# Patient Record
Sex: Male | Born: 1935 | Race: White | Hispanic: No | Smoking: Former smoker
Health system: Southern US, Community
[De-identification: ages and names within clinical notes are randomized; demographics above are authoritative.]

## PROBLEM LIST (undated history)

## (undated) DIAGNOSIS — Z22322 Carrier or suspected carrier of Methicillin resistant Staphylococcus aureus: Secondary | ICD-10-CM

## (undated) DIAGNOSIS — M869 Osteomyelitis, unspecified: Secondary | ICD-10-CM

## (undated) DIAGNOSIS — I2510 Atherosclerotic heart disease of native coronary artery without angina pectoris: Secondary | ICD-10-CM

## (undated) DIAGNOSIS — M810 Age-related osteoporosis without current pathological fracture: Secondary | ICD-10-CM

## (undated) DIAGNOSIS — C9590 Leukemia, unspecified not having achieved remission: Secondary | ICD-10-CM

## (undated) DIAGNOSIS — R011 Cardiac murmur, unspecified: Secondary | ICD-10-CM

## (undated) DIAGNOSIS — J189 Pneumonia, unspecified organism: Secondary | ICD-10-CM

## (undated) HISTORY — DX: Age-related osteoporosis without current pathological fracture: M81.0

## (undated) HISTORY — PX: OTHER SURGICAL HISTORY: SHX169

## (undated) HISTORY — PX: TONSILLECTOMY: SUR1361

---

## 2000-01-27 ENCOUNTER — Ambulatory Visit (HOSPITAL_COMMUNITY): Admission: RE | Admit: 2000-01-27 | Discharge: 2000-01-27 | Payer: Self-pay | Admitting: Orthopedic Surgery

## 2000-01-27 ENCOUNTER — Encounter: Payer: Self-pay | Admitting: Orthopedic Surgery

## 2000-02-08 ENCOUNTER — Encounter: Admission: RE | Admit: 2000-02-08 | Discharge: 2000-02-16 | Payer: Self-pay | Admitting: Orthopedic Surgery

## 2000-02-15 ENCOUNTER — Encounter: Admission: RE | Admit: 2000-02-15 | Discharge: 2000-02-15 | Payer: Self-pay | Admitting: Orthopedic Surgery

## 2000-02-15 ENCOUNTER — Encounter: Payer: Self-pay | Admitting: Orthopedic Surgery

## 2000-02-17 ENCOUNTER — Ambulatory Visit (HOSPITAL_BASED_OUTPATIENT_CLINIC_OR_DEPARTMENT_OTHER): Admission: RE | Admit: 2000-02-17 | Discharge: 2000-02-17 | Payer: Self-pay | Admitting: Orthopedic Surgery

## 2000-02-23 ENCOUNTER — Encounter: Admission: RE | Admit: 2000-02-23 | Discharge: 2000-05-23 | Payer: Self-pay | Admitting: Orthopedic Surgery

## 2001-12-10 ENCOUNTER — Emergency Department (HOSPITAL_COMMUNITY): Admission: EM | Admit: 2001-12-10 | Discharge: 2001-12-10 | Payer: Self-pay | Admitting: Emergency Medicine

## 2002-07-25 ENCOUNTER — Encounter: Payer: Self-pay | Admitting: General Surgery

## 2002-07-25 ENCOUNTER — Encounter: Admission: RE | Admit: 2002-07-25 | Discharge: 2002-07-25 | Payer: Self-pay | Admitting: General Surgery

## 2002-07-28 ENCOUNTER — Ambulatory Visit (HOSPITAL_BASED_OUTPATIENT_CLINIC_OR_DEPARTMENT_OTHER): Admission: RE | Admit: 2002-07-28 | Discharge: 2002-07-28 | Payer: Self-pay | Admitting: General Surgery

## 2003-09-16 ENCOUNTER — Encounter (HOSPITAL_BASED_OUTPATIENT_CLINIC_OR_DEPARTMENT_OTHER): Admission: RE | Admit: 2003-09-16 | Discharge: 2003-12-15 | Payer: Self-pay | Admitting: Internal Medicine

## 2011-06-02 ENCOUNTER — Inpatient Hospital Stay (HOSPITAL_COMMUNITY)
Admission: AD | Admit: 2011-06-02 | Discharge: 2011-06-08 | DRG: 496 | Disposition: A | Discharge status: Skilled Nursing Facility | Payer: Medicare Other | Source: Ambulatory Visit | Attending: Internal Medicine | Admitting: Internal Medicine

## 2011-06-02 ENCOUNTER — Encounter (HOSPITAL_COMMUNITY): Payer: Self-pay

## 2011-06-02 ENCOUNTER — Inpatient Hospital Stay (HOSPITAL_COMMUNITY): Payer: Medicare Other

## 2011-06-02 DIAGNOSIS — M810 Age-related osteoporosis without current pathological fracture: Secondary | ICD-10-CM | POA: Diagnosis present

## 2011-06-02 DIAGNOSIS — Z79899 Other long term (current) drug therapy: Secondary | ICD-10-CM

## 2011-06-02 DIAGNOSIS — Z22322 Carrier or suspected carrier of Methicillin resistant Staphylococcus aureus: Secondary | ICD-10-CM

## 2011-06-02 DIAGNOSIS — L03039 Cellulitis of unspecified toe: Secondary | ICD-10-CM | POA: Diagnosis present

## 2011-06-02 DIAGNOSIS — F17200 Nicotine dependence, unspecified, uncomplicated: Secondary | ICD-10-CM | POA: Diagnosis present

## 2011-06-02 DIAGNOSIS — L02619 Cutaneous abscess of unspecified foot: Secondary | ICD-10-CM | POA: Diagnosis present

## 2011-06-02 DIAGNOSIS — E871 Hypo-osmolality and hyponatremia: Secondary | ICD-10-CM | POA: Diagnosis present

## 2011-06-02 DIAGNOSIS — A4902 Methicillin resistant Staphylococcus aureus infection, unspecified site: Secondary | ICD-10-CM | POA: Diagnosis present

## 2011-06-02 DIAGNOSIS — E876 Hypokalemia: Secondary | ICD-10-CM | POA: Diagnosis present

## 2011-06-02 DIAGNOSIS — Z8701 Personal history of pneumonia (recurrent): Secondary | ICD-10-CM

## 2011-06-02 DIAGNOSIS — M869 Osteomyelitis, unspecified: Principal | ICD-10-CM | POA: Diagnosis present

## 2011-06-02 DIAGNOSIS — B961 Klebsiella pneumoniae [K. pneumoniae] as the cause of diseases classified elsewhere: Secondary | ICD-10-CM | POA: Diagnosis present

## 2011-06-02 DIAGNOSIS — M129 Arthropathy, unspecified: Secondary | ICD-10-CM | POA: Diagnosis present

## 2011-06-02 DIAGNOSIS — L02419 Cutaneous abscess of limb, unspecified: Secondary | ICD-10-CM | POA: Diagnosis present

## 2011-06-02 DIAGNOSIS — L0390 Cellulitis, unspecified: Secondary | ICD-10-CM | POA: Diagnosis present

## 2011-06-02 HISTORY — DX: Carrier or suspected carrier of methicillin resistant Staphylococcus aureus: Z22.322

## 2011-06-02 HISTORY — DX: Osteomyelitis, unspecified: M86.9

## 2011-06-02 HISTORY — DX: Pneumonia, unspecified organism: J18.9

## 2011-06-02 LAB — COMPREHENSIVE METABOLIC PANEL
AST: 35 U/L (ref 0–37)
Albumin: 2.5 g/dL — ABNORMAL LOW (ref 3.5–5.2)
Alkaline Phosphatase: 85 U/L (ref 39–117)
BUN: 12 mg/dL (ref 6–23)
Chloride: 99 mEq/L (ref 96–112)
Creatinine, Ser: 0.74 mg/dL (ref 0.50–1.35)
Potassium: 3.5 mEq/L (ref 3.5–5.1)
Total Protein: 6.7 g/dL (ref 6.0–8.3)

## 2011-06-02 LAB — CBC
HCT: 36.1 % — ABNORMAL LOW (ref 39.0–52.0)
MCHC: 33.5 g/dL (ref 30.0–36.0)
Platelets: 361 10*3/uL (ref 150–400)
RDW: 12.7 % (ref 11.5–15.5)
WBC: 9.7 10*3/uL (ref 4.0–10.5)

## 2011-06-02 LAB — MAGNESIUM: Magnesium: 2.1 mg/dL (ref 1.5–2.5)

## 2011-06-02 LAB — PHOSPHORUS: Phosphorus: 3 mg/dL (ref 2.3–4.6)

## 2011-06-02 MED ORDER — PIPERACILLIN-TAZOBACTAM 3.375 G IVPB
3.3750 g | Freq: Once | INTRAVENOUS | Status: AC
  Administered 2011-06-02: 3.375 g via INTRAVENOUS
  Filled 2011-06-02: qty 50

## 2011-06-02 MED ORDER — HYDROCODONE-ACETAMINOPHEN 5-325 MG PO TABS
1.0000 | ORAL_TABLET | ORAL | Status: DC | PRN
  Administered 2011-06-03 – 2011-06-04 (×3): 1 via ORAL
  Administered 2011-06-04 (×2): 2 via ORAL
  Administered 2011-06-05 (×2): 1 via ORAL
  Administered 2011-06-05 – 2011-06-06 (×3): 2 via ORAL
  Administered 2011-06-06: 1 via ORAL
  Administered 2011-06-06 – 2011-06-08 (×7): 2 via ORAL
  Filled 2011-06-02 (×2): qty 2
  Filled 2011-06-02: qty 1
  Filled 2011-06-02 (×2): qty 2
  Filled 2011-06-02 (×2): qty 1
  Filled 2011-06-02 (×4): qty 2
  Filled 2011-06-02: qty 1
  Filled 2011-06-02 (×3): qty 2
  Filled 2011-06-02: qty 1
  Filled 2011-06-02 (×4): qty 2

## 2011-06-02 MED ORDER — ENOXAPARIN SODIUM 40 MG/0.4ML ~~LOC~~ SOLN
40.0000 mg | SUBCUTANEOUS | Status: DC
  Administered 2011-06-02 – 2011-06-04 (×3): 40 mg via SUBCUTANEOUS
  Filled 2011-06-02 (×5): qty 0.4

## 2011-06-02 MED ORDER — MORPHINE SULFATE 2 MG/ML IJ SOLN
1.0000 mg | INTRAMUSCULAR | Status: DC | PRN
  Administered 2011-06-03 – 2011-06-07 (×4): 1 mg via INTRAVENOUS
  Filled 2011-06-02 (×4): qty 1

## 2011-06-02 MED ORDER — VANCOMYCIN HCL IN DEXTROSE 1-5 GM/200ML-% IV SOLN
1000.0000 mg | Freq: Two times a day (BID) | INTRAVENOUS | Status: DC
  Administered 2011-06-03 – 2011-06-08 (×11): 1000 mg via INTRAVENOUS
  Filled 2011-06-02 (×13): qty 200

## 2011-06-02 MED ORDER — ONDANSETRON HCL 4 MG PO TABS: 4.0000 mg | ORAL_TABLET | Freq: Four times a day (QID) | ORAL | Status: DC | PRN

## 2011-06-02 MED ORDER — ENOXAPARIN SODIUM 30 MG/0.3ML ~~LOC~~ SOLN
30.0000 mg | SUBCUTANEOUS | Status: DC
  Filled 2011-06-02 (×2): qty 0.3

## 2011-06-02 MED ORDER — ONDANSETRON HCL 4 MG/2ML IJ SOLN: 4.0000 mg | Freq: Four times a day (QID) | INTRAMUSCULAR | Status: DC | PRN

## 2011-06-02 MED ORDER — PIPERACILLIN-TAZOBACTAM 3.375 G IVPB
3.3750 g | Freq: Three times a day (TID) | INTRAVENOUS | Status: DC
  Administered 2011-06-03 – 2011-06-04 (×5): 3.375 g via INTRAVENOUS
  Filled 2011-06-02 (×8): qty 50

## 2011-06-02 MED ORDER — SODIUM CHLORIDE 0.9 % IV SOLN
INTRAVENOUS | Status: AC
  Administered 2011-06-02: 50 mL/h via INTRAVENOUS

## 2011-06-02 MED ORDER — VANCOMYCIN HCL IN DEXTROSE 1-5 GM/200ML-% IV SOLN
1000.0000 mg | Freq: Once | INTRAVENOUS | Status: AC
  Administered 2011-06-02: 1000 mg via INTRAVENOUS
  Filled 2011-06-02: qty 200

## 2011-06-02 NOTE — Progress Notes (Signed)
ANTIBIOTIC CONSULT NOTE - INITIAL  Pharmacy Consult for Vancomycin/Zosyn Indication: LLE cellulitis  No Known Allergies  Patient Measurements: Per patient: Height: 72 in Weight: 78kg   Vital Signs: Temp: 98.7 F (37.1 C) (03/22 1359) Temp src: Oral (03/22 1359) BP: 134/78 mmHg (03/22 1359) Pulse Rate: 79  (03/22 1359) Intake/Output from previous day:   Intake/Output from this shift:    Labs: No results found for this basename: WBC:3,HGB:3,PLT:3,LABCREA:3,CREATININE:3 in the last 72 hours    Microbiology: None ordered  Medical History: Past Medical History  Diagnosis Date  . Pneumonia     Medications:  Scheduled:    . enoxaparin  30 mg Subcutaneous Q24H   Infusions:    . sodium chloride     Assessment:  87 YOM   Goal of Therapy:  Vancomycin trough level 15-20 mcg/ml until osteo ruled out.  Plan:   Vancomycin 1gm IV q12h  Check trough at steady state Zosyn 3.375gm IV q8h (4hr extended infusions)  Follow renal function, cultures & MRI results   Loralee Pacas, PharmD, BCPS Pager: [phone] 06/02/2011,2:53 PM

## 2011-06-02 NOTE — H&P (Signed)
PCP:  Ezequiel Kayser, MD, MD   DOA:  06/02/2011  1:16 PM  Chief Complaint:  Left LE swelling  HPI: Pt is 78 yo male who presents with left lower extremity swelling and erythema that initially started one week prior to admission and has been getting progressively worse. He was given antibiotics but is not sure of the name. He has not had any improvement on that antibiotic. Swelling however has gotten better. He describes subjective fevers and chill, no chest pain or shortness of breath, no other systemic symptoms, no abdominal or urinary concerns. No similar events in the past, as difficulty weight bearing on the left extremity but can still ambulate. No specific aggravating or alleviating factors.   Allergies: No Known Allergies  Prior to Admission medications   Not on File    Past Medical History  Diagnosis Date  . Pneumonia     Past Surgical History  Procedure Date  . Left  rotator cuff repair   . Tonsillectomy     Social History:  reports that he has been smoking Cigars.  He has never used smokeless tobacco. He reports that he does not drink alcohol or use illicit drugs.  History reviewed. No pertinent family history.  Review of Systems:  Constitutional: Denies diaphoresis, appetite change and fatigue.  HEENT: Denies photophobia, eye pain, redness, hearing loss, ear pain, congestion, sore throat, rhinorrhea, sneezing, mouth sores, trouble swallowing, neck pain, neck stiffness and tinnitus.   Respiratory: Denies SOB, DOE, cough, chest tightness,  and wheezing.   Cardiovascular: Denies chest pain, palpitations and leg swelling.  Gastrointestinal: Denies nausea, vomiting, abdominal pain, diarrhea, constipation, blood in stool and abdominal distention.  Genitourinary: Denies dysuria, urgency, frequency, hematuria, flank pain and difficulty urinating.  Skin: Denies pallor, rash and wound.  Neurological: Denies dizziness, seizures, syncope, weakness, light-headedness, numbness  and headaches.  Hematological: Denies adenopathy. Easy bruising, personal or family bleeding history  Psychiatric/Behavioral: Denies suicidal ideation, mood changes, confusion, nervousness, sleep disturbance and agitation  Physical Exam:  Filed Vitals:   06/02/11 1359  BP: 134/78  Pulse: 79  Temp: 98.7 F (37.1 C)  TempSrc: Oral  Resp: 16  SpO2: 98%    Constitutional: Vital signs reviewed.  Patient is in no acute distress and cooperative with exam. Alert and oriented x3.  Head: Normocephalic and atraumatic Ear: TM normal bilaterally Mouth: no erythema or exudates, MMM Eyes: PERRL, EOMI, conjunctivae normal, No scleral icterus.  Neck: Supple, Trachea midline normal ROM, No JVD, mass, thyromegaly, or carotid bruit present.  Cardiovascular: RRR, S1 normal, S2 normal, no MRG, pulses symmetric and intact bilaterally Pulmonary/Chest: CTAB, no wheezes, rales, or rhonchi Abdominal: Soft. Non-tender, non-distended, bowel sounds are normal, no masses, organomegaly, or guarding present.  GU: no CVA tenderness Musculoskeletal: No joint deformities, erythema, or stiffness, ROM full and no nontender Ext: Left lower extremity swelling and erythema extending from the foot to the knee, tender to palpation, warm to touch, no acute findings on the right lower extremity Hematology: no cervical, inginal, or axillary adenopathy.  Neurological: A&O x3, Strenght is normal and symmetric bilaterally, cranial nerve II-XII are grossly intact, no focal motor deficit, sensory intact to light touch bilaterally.  Skin: Warm, dry and intact. No rash, cyanosis, or clubbing.  Psychiatric: Normal mood and affect. speech and behavior is normal. Judgment and thought content normal. Cognition and memory are normal.   Labs on Admission:  No results found for this or any previous visit (from the past 48 hour(s)).  Radiological Exams  on Admission: No results found.  Assessment/Plan  Left Lower extremity  cellulitis - will admit to medical floor and monitor vitals per floor protocol - obtain blood work: CBC, BMP, blood cultures if T > 101.23F - start broad spectrum antibiotics for now and taper down if improving - may need to get surgery consult if no improvement over 24 hours - keep extremity elevated - awaiting medical reconciliation to be completed to address home medications  DVT Prophylaxis - Lovenox  Code Status - Full  Education  - test results and diagnostic studies were discussed with patient  - patient verbalized the understanding - questions were answered at the bedside and contact information was provided for additional questions or concerns  Time Spent on Admission: Over 30 minutes  MAGICK-Kamali Nephew 06/02/2011, 2:32 PM  [facility] Pager # [phone] Main Office # [phone]

## 2011-06-03 ENCOUNTER — Inpatient Hospital Stay (HOSPITAL_COMMUNITY): Payer: Medicare Other

## 2011-06-03 LAB — CBC
HCT: 34.8 % — ABNORMAL LOW (ref 39.0–52.0)
Hemoglobin: 11.5 g/dL — ABNORMAL LOW (ref 13.0–17.0)
MCH: 31.3 pg (ref 26.0–34.0)
MCV: 94.8 fL (ref 78.0–100.0)
RBC: 3.67 MIL/uL — ABNORMAL LOW (ref 4.22–5.81)

## 2011-06-03 LAB — BASIC METABOLIC PANEL
CO2: 27 mEq/L (ref 19–32)
Calcium: 8.5 mg/dL (ref 8.4–10.5)
Chloride: 94 mEq/L — ABNORMAL LOW (ref 96–112)
Glucose, Bld: 93 mg/dL (ref 70–99)
Potassium: 3.1 mEq/L — ABNORMAL LOW (ref 3.5–5.1)
Sodium: 130 mEq/L — ABNORMAL LOW (ref 135–145)

## 2011-06-03 LAB — HEMOGLOBIN A1C
Hgb A1c MFr Bld: 5.3 % (ref <5.7)
Mean Plasma Glucose: 105 mg/dL (ref <117)

## 2011-06-03 MED ORDER — POTASSIUM CHLORIDE CRYS ER 20 MEQ PO TBCR
40.0000 meq | EXTENDED_RELEASE_TABLET | Freq: Two times a day (BID) | ORAL | Status: AC
  Administered 2011-06-03 (×2): 40 meq via ORAL
  Filled 2011-06-03 (×2): qty 2

## 2011-06-03 NOTE — Consult Note (Signed)
Reason for Consult: left great toe infection  Referring Physician: MD AZARIEL BANIK is an 78 y.o. male.  HPI: s/p corn removal developed infection tx opt with antibiotics by Podiatrist. Admit with infection.  Past Medical History  Diagnosis Date  . Pneumonia     Past Surgical History  Procedure Date  . Left  rotator cuff repair   . Tonsillectomy     History reviewed. No pertinent family history.  Social History:  reports that he has been smoking Cigars.  He has never used smokeless tobacco. He reports that he does not drink alcohol or use illicit drugs.  Allergies: No Known Allergies  Medications: I have reviewed the patient's current medications.  Results for orders placed during the hospital encounter of 06/02/11 (from the past 48 hour(s))  CBC     Status: Abnormal   Collection Time   06/02/11  3:13 PM      Component Value Range Comment   WBC 9.7  4.0 - 10.5 (K/uL)    RBC 3.81 (*) 4.22 - 5.81 (MIL/uL)    Hemoglobin 12.1 (*) 13.0 - 17.0 (g/dL)    HCT 16.1 (*) 09.6 - 52.0 (%)    MCV 94.8  78.0 - 100.0 (fL)    MCH 31.8  26.0 - 34.0 (pg)    MCHC 33.5  30.0 - 36.0 (g/dL)    RDW 04.5  40.9 - 81.1 (%)    Platelets 361  150 - 400 (K/uL)   COMPREHENSIVE METABOLIC PANEL     Status: Abnormal   Collection Time   06/02/11  3:13 PM      Component Value Range Comment   Sodium 135  135 - 145 (mEq/L)    Potassium 3.5  3.5 - 5.1 (mEq/L)    Chloride 99  96 - 112 (mEq/L)    CO2 29  19 - 32 (mEq/L)    Glucose, Bld 97  70 - 99 (mg/dL)    BUN 12  6 - 23 (mg/dL)    Creatinine, Ser 9.14  0.50 - 1.35 (mg/dL)    Calcium 8.8  8.4 - 10.5 (mg/dL)    Total Protein 6.7  6.0 - 8.3 (g/dL)    Albumin 2.5 (*) 3.5 - 5.2 (g/dL)    AST 35  0 - 37 (U/L)    ALT 38  0 - 53 (U/L)    Alkaline Phosphatase 85  39 - 117 (U/L)    Total Bilirubin 0.4  0.3 - 1.2 (mg/dL)    GFR calc non Af Amer 82 (*) >90 (mL/min)    GFR calc Af Amer >90  >90 (mL/min)   MAGNESIUM     Status: Normal   Collection Time   06/02/11  3:13 PM      Component Value Range Comment   Magnesium 2.1  1.5 - 2.5 (mg/dL)   PHOSPHORUS     Status: Normal   Collection Time   06/02/11  3:13 PM      Component Value Range Comment   Phosphorus 3.0  2.3 - 4.6 (mg/dL)   TSH     Status: Normal   Collection Time   06/02/11  3:13 PM      Component Value Range Comment   TSH 0.643  0.350 - 4.500 (uIU/mL)   HEMOGLOBIN A1C     Status: Normal   Collection Time   06/02/11  3:13 PM      Component Value Range Comment   Hemoglobin A1C 5.3  <5.7 (%)  Mean Plasma Glucose 105  <117 (mg/dL)   BASIC METABOLIC PANEL     Status: Abnormal   Collection Time   06/03/11  4:35 AM      Component Value Range Comment   Sodium 130 (*) 135 - 145 (mEq/L)    Potassium 3.1 (*) 3.5 - 5.1 (mEq/L)    Chloride 94 (*) 96 - 112 (mEq/L)    CO2 27  19 - 32 (mEq/L)    Glucose, Bld 93  70 - 99 (mg/dL)    BUN 10  6 - 23 (mg/dL)    Creatinine, Ser 1.61  0.50 - 1.35 (mg/dL)    Calcium 8.5  8.4 - 10.5 (mg/dL)    GFR calc non Af Amer 77 (*) >90 (mL/min)    GFR calc Af Amer 89 (*) >90 (mL/min)   CBC     Status: Abnormal   Collection Time   06/03/11  4:35 AM      Component Value Range Comment   WBC 9.8  4.0 - 10.5 (K/uL)    RBC 3.67 (*) 4.22 - 5.81 (MIL/uL)    Hemoglobin 11.5 (*) 13.0 - 17.0 (g/dL)    HCT 09.6 (*) 04.5 - 52.0 (%)    MCV 94.8  78.0 - 100.0 (fL)    MCH 31.3  26.0 - 34.0 (pg)    MCHC 33.0  30.0 - 36.0 (g/dL)    RDW 40.9  81.1 - 91.4 (%)    Platelets 385  150 - 400 (K/uL)     [name]  06/03/2011  *RADIOLOGY REPORT*  Clinical Data: Open wound on the great toe and dorsal aspect of the distal metatarsal.  Question osteomyelitis.  MRI OF THE LEFT FOREFOOT WITHOUT CONTRAST  Technique:  Multiplanar, multisequence MR imaging was performed. No intravenous contrast was administered.  Comparison: None.  Findings: A large ulceration is identified in the dorsal soft tissues centered over the proximal phalanx of the great toe.  A fluid  collection is seen over the dorsal aspect of the distal phalanx measuring 1.3 cm transverse by 0.9 cm cranial-caudal by 1.7 cm long.  Diffuse and marked subcutaneous edema is present about the foot.  There is bone marrow edema throughout the distal phalanx of the great toe.  Also seen is marrow edema in the proximal phalanx of the great toe.  Edema is most intense in the distal 0.7 cm of the proximal phalanx with mild marrow edema also seen throughout the diaphysis.  Bone marrow signal is otherwise normal.  The patient has small joint effusions at all of the MTP joints.  No mass is identified. No muscle or tendon tear.  IMPRESSION:  1.  Large skin ulceration over the dorsum of the great toe.  Fluid collection over the dorsum of the distal phalanx is consistent with an abscess. 2.  Signal abnormality throughout the distal phalanx of the great toe is consistent with osteomyelitis.  Marrow signal abnormality is also seen in the proximal phalanx of the great toe worst in the head compatible with osteomyelitis. 3.  Diffuse subcutaneous edema over the dorsum of the foot could be due to cellulitis and/or dependent change.  Original Report Authenticated By: Bernadene Bell. Maricela Curet, M.D.    Review of Systems  Constitutional: Negative.   HENT: Negative.   Eyes: Negative.   Respiratory: Negative.   Cardiovascular: Negative.   Gastrointestinal: Negative.   Genitourinary: Negative.   Musculoskeletal: Positive for joint pain.  Skin: Negative.   Neurological: Negative.  Endo/Heme/Allergies: Negative.   Psychiatric/Behavioral: Negative.    Blood pressure 133/74, pulse 74, temperature 97.8 F (36.6 C), temperature source Oral, resp. rate 20, height 6' (1.829 m), weight 77.565 kg (171 lb), SpO2 93.00%. Physical Exam  Constitutional: He is oriented to person, place, and time. He appears well-developed.  HENT:  Head: Normocephalic.  Eyes: Pupils are equal, round, and reactive to light.  Cardiovascular: Normal rate.    Respiratory: Effort normal.  GI: Soft.  Musculoskeletal: He exhibits edema and tenderness.       Left great toe erythema. Open draining wound distal and dorsal. Nail loose. 1+DP Pt sensation intact. No DVT. Ankle good ROM no pain. No pain MTP.  Neurological: He is alert and oriented to person, place, and time.  Skin: There is erythema.  Psychiatric: He has a normal mood and affect. His behavior is normal.    Assessment/Plan: Infected left great toe with osteo of distal and proximal phalanx with abscess. Plan I&D risks discussed. Op report dictated [id number]. Culture obtained. Plan dressing change tomorrow check cultures. Continue Ab. Will consult Dr. Victorino Dike or Lestine Box for further management. NWB Jordie Schreur [id number]. Xray.  Tiburcio Linder C 06/03/2011, 11:11 AM

## 2011-06-03 NOTE — Progress Notes (Signed)
Patient ID: [name], male   DOB: [birth year], 78 y.o.   MRN: [id number]  Subjective: No events overnight. Patient denies chest pain, shortness of breath, abdominal pain. He reports less pain in left lower extremity.  Objective:  Vital signs in last 24 hours:  Filed Vitals:   06/02/11 1359 06/02/11 2130 06/03/11 0620  BP: 134/78 127/69 133/74  Pulse: 79 93 74  Temp: 98.7 F (37.1 C) 98.6 F (37 C) 97.8 F (36.6 C)  TempSrc: Oral Oral Oral  Resp: 16 20 20   Height: 6' (1.829 m)    Weight: 77.565 kg (171 lb)    SpO2: 98% 94% 93%    Intake/Output from previous day:   Intake/Output Summary (Last 24 hours) at 06/03/11 1427 Last data filed at 06/03/11 0830  Gross per 24 hour  Intake   1700 ml  Output      0 ml  Net   1700 ml    Physical Exam: General: Alert, awake, oriented x3, in no acute distress. HEENT: No bruits, no goiter. Moist mucous membranes, no scleral icterus, no conjunctival pallor. Heart: Regular rate and rhythm, S1/S2 +, no murmurs, rubs, gallops. Lungs: Clear to auscultation bilaterally. No wheezing, no rhonchi, no rales.  Abdomen: Soft, nontender, nondistended, positive bowel sounds. Extremities: +1 lower extremity pitting edema (improving) with erythema )also improving), left great toe with open wound and draining pus, erythematous and tender to palpation Neuro: Grossly nonfocal.  Lab Results:  Basic Metabolic Panel:    Component Value Date/Time   NA 130* 06/03/2011 0435   K 3.1* 06/03/2011 0435   CL 94* 06/03/2011 0435   CO2 27 06/03/2011 0435   BUN 10 06/03/2011 0435   CREATININE 0.86 06/03/2011 0435   GLUCOSE 93 06/03/2011 0435   CALCIUM 8.5 06/03/2011 0435   CBC:    Component Value Date/Time   WBC 9.8 06/03/2011 0435   HGB 11.5* 06/03/2011 0435   HCT 34.8* 06/03/2011 0435   PLT 385 06/03/2011 0435   MCV 94.8 06/03/2011 0435      Lab 06/03/11 0435 06/02/11 1513  WBC 9.8 9.7  HGB 11.5* 12.1*  HCT 34.8* 36.1*  PLT 385 361  MCV 94.8 94.8    MCH 31.3 31.8  MCHC 33.0 33.5  RDW 12.8 12.7  LYMPHSABS -- --  MONOABS -- --  EOSABS -- --  BASOSABS -- --  BANDABS -- --    Lab 06/03/11 0435 06/02/11 1513  NA 130* 135  K 3.1* 3.5  CL 94* 99  CO2 27 29  GLUCOSE 93 97  BUN 10 12  CREATININE 0.86 0.74  CALCIUM 8.5 8.8  MG -- 2.1   Studies/Results: [name]  06/03/2011  *RADIOLOGY REPORT*  Clinical Data: Open wound on the great toe and dorsal aspect of the distal metatarsal.  Question osteomyelitis.  MRI OF THE LEFT FOREFOOT WITHOUT CONTRAST  Technique:  Multiplanar, multisequence MR imaging was performed. No intravenous contrast was administered.  Comparison: None.  Findings: A large ulceration is identified in the dorsal soft tissues centered over the proximal phalanx of the great toe.  A fluid collection is seen over the dorsal aspect of the distal phalanx measuring 1.3 cm transverse by 0.9 cm cranial-caudal by 1.7 cm long.  Diffuse and marked subcutaneous edema is present about the foot.  There is bone marrow edema throughout the distal phalanx of the great toe.  Also seen is marrow edema in the proximal phalanx of the great toe.  Edema  is most intense in the distal 0.7 cm of the proximal phalanx with mild marrow edema also seen throughout the diaphysis.  Bone marrow signal is otherwise normal.  The patient has small joint effusions at all of the MTP joints.  No mass is identified. No muscle or tendon tear.  IMPRESSION:  1.  Large skin ulceration over the dorsum of the great toe.  Fluid collection over the dorsum of the distal phalanx is consistent with an abscess. 2.  Signal abnormality throughout the distal phalanx of the great toe is consistent with osteomyelitis.  Marrow signal abnormality is also seen in the proximal phalanx of the great toe worst in the head compatible with osteomyelitis. 3.  Diffuse subcutaneous edema over the dorsum of the foot could be due to cellulitis and/or dependent change.  Original Report  Authenticated By: Bernadene Bell. Maricela Curet, M.D.    Medications: Scheduled Meds:   . enoxaparin  40 mg Subcutaneous Q24H  . piperacillin-tazobactam (ZOSYN)  IV  3.375 g Intravenous Once  . piperacillin-tazobactam (ZOSYN)  IV  3.375 g Intravenous Q8H  . potassium chloride  40 mEq Oral BID  . vancomycin  1,000 mg Intravenous Once  . vancomycin  1,000 mg Intravenous Q12H  . DISCONTD: enoxaparin  30 mg Subcutaneous Q24H   Continuous Infusions:   . sodium chloride 50 mL/hr (06/02/11 1530)   PRN Meds:.HYDROcodone-acetaminophen, morphine, ondansetron (ZOFRAN) IV, ondansetron  Assessment/Plan:  Left Lower extremity cellulitis  - slight improvement over 24 hours on broad spectrum antibiotics - infection in the great left toes still unchanged and will likely need I&D - ortho consult obtained for further evaluation - continue supportive care with analgesia, antibiotics - keep extremity elevated   Osteomyelitis - please see the MRI findings noted above - follow up on ortho recommendations - continue antibiotics   Hypokalemia - supplement - BMP in AM  Hyponatremia - likely pre-renal etiology - IVF provided - BMP in AM  DVT Prophylaxis - Lovenox  Code Status - Full    EDUCATION - test results and diagnostic studies were discussed with patient and pt's family who was present at the bedside (daughter) - patient and family have verbalized the understanding - questions were answered at the bedside and contact information was provided for additional questions or concerns   LOS: 1 day   Debbora Presto 06/03/2011, 2:27 PM  [facility] Pager: [phone]

## 2011-06-04 LAB — CBC
Platelets: 375 10*3/uL (ref 150–400)
RBC: 3.51 MIL/uL — ABNORMAL LOW (ref 4.22–5.81)
RDW: 13.2 % (ref 11.5–15.5)
WBC: 10.3 10*3/uL (ref 4.0–10.5)

## 2011-06-04 LAB — BASIC METABOLIC PANEL
CO2: 27 mEq/L (ref 19–32)
Calcium: 8 mg/dL — ABNORMAL LOW (ref 8.4–10.5)
Chloride: 106 mEq/L (ref 96–112)
GFR calc Af Amer: 86 mL/min — ABNORMAL LOW (ref >90)
Sodium: 138 mEq/L (ref 135–145)

## 2011-06-04 LAB — VANCOMYCIN, TROUGH: Vancomycin Tr: 15 ug/mL (ref 10.0–20.0)

## 2011-06-04 NOTE — Progress Notes (Signed)
ANTIBIOTIC CONSULT NOTE - Follow-Up  Pharmacy Consult for Vancomycin/Zosyn Indication: LLE cellulitis  No Known Allergies  Patient Measurements: Per patient: Height: 72 in Weight: 78kg   Vital Signs: Temp: 97.4 F (36.3 C) (03/24 1500) Temp src: Oral (03/24 1500) BP: 133/76 mmHg (03/24 1500) Pulse Rate: 78  (03/24 1500) Intake/Output from previous day: 03/23 0701 - 03/24 0700 In: 3220 [P.O.:720; I.V.:1950; IV Piggyback:550] Out: -  Intake/Output from this shift: Total I/O In: 720 [P.O.:720] Out: -   Labs:  Basename 06/04/11 0450 06/03/11 0435 06/02/11 1513  WBC 10.3 9.8 9.7  HGB 11.0* 11.5* 12.1*  PLT 375 385 361  LABCREA -- -- --  CREATININE 0.94 0.86 0.74      Microbiology: None ordered  Medical History: Past Medical History  Diagnosis Date  . Pneumonia     Medications:  Scheduled:     . enoxaparin  40 mg Subcutaneous Q24H  . potassium chloride  40 mEq Oral BID  . vancomycin  1,000 mg Intravenous Q12H  . DISCONTD: piperacillin-tazobactam (ZOSYN)  IV  3.375 g Intravenous Q8H   Infusions:   Assessment:  Day #3 Vancomycin for cellulitis/abscess/osteomyelitis  Vanc trough at goal  Goal of Therapy:  Vancomycin trough level 15-20 mcg/ml  Plan:   Continue Vancomycin 1g IV q12  Hessie Knows, PharmD, BCPS pager [phone] 06/04/2011,4:09 PM

## 2011-06-04 NOTE — Progress Notes (Signed)
Orthopedics Progress Note  Subjective: Pt resting comfortably in no acute distress. Minimal pain to left great toe today  Objective:  Filed Vitals:   06/04/11 0605  BP: 113/80  Pulse: 71  Temp: 97.3 F (36.3 C)  Resp: 20    General: Awake and alert  Musculoskeletal: Left great toe with mild erythema and mild edema, nv intact distally Toe nail removed yesterday, Pt non weight bearing on left great toe Neurovascularly intact  Lab Results  Component Value Date   WBC 10.3 06/04/2011   HGB 11.0* 06/04/2011   HCT 33.8* 06/04/2011   MCV 96.3 06/04/2011   PLT 375 06/04/2011       Component Value Date/Time   NA 138 06/04/2011 0450   K 4.0 06/04/2011 0450   CL 106 06/04/2011 0450   CO2 27 06/04/2011 0450   GLUCOSE 93 06/04/2011 0450   BUN 10 06/04/2011 0450   CREATININE 0.94 06/04/2011 0450   CALCIUM 8.0* 06/04/2011 0450   GFRNONAA 74* 06/04/2011 0450   GFRAA 86* 06/04/2011 0450    No results found for this basename: INR, PROTIME    Assessment/Plan: Infection left great toe   Continue IV antibiotics Dressing change daily  Will consult with Dr. Victorino Dike about further recommendations  Almedia Balls. Ranell Patrick, MD 06/04/2011 7:44 AM

## 2011-06-04 NOTE — Progress Notes (Signed)
Patient ID: [name], male   DOB: 03/28/1924, 78 y.o.   MRN: [id number]  Subjective: No events overnight. Patient denies chest pain, shortness of breath, abdominal pain.  Objective:  Vital signs in last 24 hours:  Filed Vitals:   06/03/11 0620 06/03/11 1455 06/03/11 2149 06/04/11 0605  BP: 133/74 123/65 111/67 113/80  Pulse: 74 74 78 71  Temp: 97.8 F (36.6 C) 100.8 F (38.2 C) 97.8 F (36.6 C) 97.3 F (36.3 C)  TempSrc: Oral Oral Oral Oral  Resp: 20 20 20 20   Height:      Weight:      SpO2: 93% 97% 94% 97%    Intake/Output from previous day:   Intake/Output Summary (Last 24 hours) at 06/04/11 1147 Last data filed at 06/04/11 0756  Gross per 24 hour  Intake   3340 ml  Output      0 ml  Net   3340 ml    Physical Exam: General: Alert, awake, oriented x3, in no acute distress. HEENT: No bruits, no goiter. Moist mucous membranes, no scleral icterus, no conjunctival pallor. Heart: Regular rate and rhythm, S1/S2 +, no murmurs, rubs, gallops. Lungs: Clear to auscultation bilaterally. No wheezing, no rhonchi, no rales.  Abdomen: Soft, nontender, nondistended, positive bowel sounds. Neuro: Grossly nonfocal.  Lab Results:  Basic Metabolic Panel:  Lab 06/04/11 4098 06/03/11 0435 06/02/11 1513  WBC 10.3 9.8 9.7  HGB 11.0* 11.5* 12.1*  HCT 33.8* 34.8* 36.1*  PLT 375 385 361  MCV 96.3 94.8 94.8  MCH 31.3 31.3 31.8  MCHC 32.5 33.0 33.5  RDW 13.2 12.8 12.7  LYMPHSABS -- -- --  MONOABS -- -- --  EOSABS -- -- --  BASOSABS -- -- --  BANDABS -- -- --    Lab 06/04/11 0450 06/03/11 0435 06/02/11 1513  NA 138 130* 135  K 4.0 3.1* 3.5  CL 106 94* 99  CO2 27 27 29   GLUCOSE 93 93 97  BUN 10 10 12   CREATININE 0.94 0.86 0.74  CALCIUM 8.0* 8.5 8.8  MG -- -- 2.1    Recent Results (from the past 240 hour(s))  WOUND CULTURE     Status: Normal (Preliminary result)   Collection Time   06/03/11 11:15 AM      Component Value Range Status Comment   Specimen Description  TOE   Final    Special Requests Normal   Final    Gram Stain     Final    Value: NO WBC SEEN     RARE SQUAMOUS EPITHELIAL CELLS PRESENT     NO ORGANISMS SEEN   Culture     Final    Value: MODERATE STAPHYLOCOCCUS AUREUS     Note: RIFAMPIN AND GENTAMICIN SHOULD NOT BE USED AS SINGLE DRUGS FOR TREATMENT OF STAPH INFECTIONS.   Report Status PENDING   Incomplete     Studies/Results: [name]  06/03/2011  *RADIOLOGY REPORT*  Clinical Data: Open wound on the great toe and dorsal aspect of the distal metatarsal.  Question osteomyelitis.  MRI OF THE LEFT FOREFOOT WITHOUT CONTRAST  Technique:  Multiplanar, multisequence MR imaging was performed. No intravenous contrast was administered.  Comparison: None.  Findings: A large ulceration is identified in the dorsal soft tissues centered over the proximal phalanx of the great toe.  A fluid collection is seen over the dorsal aspect of the distal phalanx measuring 1.3 cm transverse by 0.9 cm cranial-caudal by 1.7 cm long.  Diffuse and  marked subcutaneous edema is present about the foot.  There is bone marrow edema throughout the distal phalanx of the great toe.  Also seen is marrow edema in the proximal phalanx of the great toe.  Edema is most intense in the distal 0.7 cm of the proximal phalanx with mild marrow edema also seen throughout the diaphysis.  Bone marrow signal is otherwise normal.  The patient has small joint effusions at all of the MTP joints.  No mass is identified. No muscle or tendon tear.  IMPRESSION:  1.  Large skin ulceration over the dorsum of the great toe.  Fluid collection over the dorsum of the distal phalanx is consistent with an abscess. 2.  Signal abnormality throughout the distal phalanx of the great toe is consistent with osteomyelitis.  Marrow signal abnormality is also seen in the proximal phalanx of the great toe worst in the head compatible with osteomyelitis. 3.  Diffuse subcutaneous edema over the dorsum of the foot  could be due to cellulitis and/or dependent change.  Original Report Authenticated By: Bernadene Bell. D'ALESSIO, M.D.   Dg Foot Complete Left  06/03/2011  *RADIOLOGY REPORT*  Clinical Data: Evaluate great toe post I&D  LEFT FOOT - COMPLETE 3+ VIEW  Comparison: Left foot MRI - 06/02/2011  Findings: Soft tissue defect about the tip/tuft of the great toe. This findings/with osteolysis of the tuft of the great toe compatible with osteomyelitis demonstrated on prior foot MRI. There is extensive soft tissue swelling about the foot.  No radiopaque foreign body.   No fracture.  Joint spaces are grossly preserved. Plantar calcaneal spur.  IMPRESSION: 1.  Soft tissue defect adjacent to the tuft of the great toe compatible with provided history of I&D.  No radiopaque foreign body.  2.  Osteolysis of the tuft of the great toe worrisome for osteomyelitis as demonstrated on foot MRI.  Original Report Authenticated By: Waynard Reeds, M.D.    Medications: Scheduled Meds:   . enoxaparin  40 mg Subcutaneous Q24H  . piperacillin-tazobactam (ZOSYN)  IV  3.375 g Intravenous Q8H  . potassium chloride  40 mEq Oral BID  . vancomycin  1,000 mg Intravenous Q12H   Continuous Infusions:  PRN Meds:.HYDROcodone-acetaminophen, morphine, ondansetron (ZOFRAN) IV, ondansetron  Assessment/Plan:  Left Lower extremity cellulitis  - slight improvement over 24 hours on broad spectrum antibiotics and now s/p I&D (done 06/03/2011) - appreciate ortho input - continue supportive care with analgesia, antibiotics  - keep extremity elevated   Osteomyelitis  - please see the MRI findings noted above  - follow up on ortho recommendations  - continue antibiotics but will d/c Zosyn as recommended by ID, appreciate the input  Hypokalemia  - supplement  - BMP in AM   Hyponatremia  - likely pre-renal etiology  - IVF provided  - resolved  DVT Prophylaxis - Lovenox  Code Status - Full   EDUCATION  - test results and diagnostic  studies were discussed with patient - patient verbalized the understanding  - questions were answered at the bedside and contact information was provided for additional questions or concerns    LOS: 2 days   MAGICK-Angelus Hoopes 06/04/2011, 11:47 AM  [facility] Pager: [phone]

## 2011-06-04 NOTE — Op Note (Signed)
NAMEVONN, SLIGER               ACCOUNT NO.:  [id number]  MEDICAL RECORD NO.:  [id number]  LOCATION:  1602                         FACILITY:  [facility]  PHYSICIAN:  Jene Every, M.D.    DATE OF BIRTH:  [birth year]  DATE OF PROCEDURE:  06/02/2011 DATE OF DISCHARGE:                              OPERATIVE REPORT   PREOPERATIVE DIAGNOSIS:  Osteomyelitis with abscess of the left great toe.  POSTOPERATIVE DIAGNOSIS:  Osteomyelitis with abscess of the left great toe.  PROCEDURES PERFORMED: 1. Irrigation and debridement of left great toe with removal of the     entire nail. 2. Debridement of the distal phalanx of the great toe with partial     removal of the distal phalanx of great toe. 3. Irrigation and debridement and drainage of abscess of the dorsum of     the great toe with open packing.  ANESTHESIA:  Local 1% lidocaine.  No assistant.  BRIEF HISTORY:  This is an 78 year old who was admitted to the hospital with an open wound draining after corn removal by a local podiatrist. It got infected, he was on antibiotics for 10 days.  Admitted by the hospitalist, placed him on antibiotics.  Cellulitis around the leg, foot, and ankle has improved, but he has persistent soft tissue swelling and erythema of the great toe.  He had an MRI indicating abscess in the subcutaneous tissue on the dorsum of the great toe as well as bone marrow edema in the distal phalanx and the proximal phalanx consistent with osteomyelitis.  Indicated for I and D, obtaining cultures.  Risks and benefits discussed were bleeding, infection, need for amputation, repeat debridement, DVT, PE, anesthetic complications, etc.  TECHNIQUE:  The patient in supine position, after the great toe was prepped and draped in the usual sterile fashion under strict sterile conditions, I infiltrated the great toe with 1% lidocaine, performing a digital block medially and laterally, and also on the dorsum.  The patient had a  small wound over the tip of the toe draining some mild purulent fluid.  Obtained a culture of that and sent it for aerobic and anaerobic cultures.  I then bluntly dissected it down to the open distal phalanx which was exposed and removed a small portion of the tip with a hemostat.  Then irrigated around that wound and debrided the wound edges, it was 3 x 3 mm.  I then packed that open.  The nail was loose and it was fairly devitalized.  Therefore, removed the nail without difficulty.  I opened up just beneath the nail bed with an 11 blade as radiologist indicated there was pus beneath the nail bed as well.  I got a good bleeding tissue.  Then, on the dorsum, more proximally over the ITP joint, there was a fair amount of callus formation and kind of a bloody, blistered abscess.  I incised that and was able to obtain a draining red fluid, it was consistent more with blood than it was an abscess.  I copiously irrigated all wounds and I packed each of the wound with the end of a 4 x 4's, and then we wrapped the great toe with 4 x  4's and Kerlix.  He had a good dorsalis pedis and posterior tibial pulse.  Good sensation.  He was not a diabetic.  The plan is to continue IV antibiotics, await for culture.  Consult Dr. Michele Rockers for further delineation.  He may require an amputation for osteomyelitis versus IV antibiotics for 6 weeks.  Nonweightbearing and elevation.     Jene Every, M.D.     Cordelia Pen  D:  06/03/2011  T:  06/04/2011  Job:  [id number]

## 2011-06-05 ENCOUNTER — Encounter (HOSPITAL_COMMUNITY)
Admission: AD | Disposition: A | Discharge status: Skilled Nursing Facility | Payer: Self-pay | Source: Ambulatory Visit | Attending: Internal Medicine

## 2011-06-05 ENCOUNTER — Inpatient Hospital Stay (HOSPITAL_COMMUNITY): Payer: Medicare Other | Admitting: Anesthesiology

## 2011-06-05 ENCOUNTER — Encounter (HOSPITAL_COMMUNITY): Payer: Self-pay | Admitting: Anesthesiology

## 2011-06-05 DIAGNOSIS — M869 Osteomyelitis, unspecified: Secondary | ICD-10-CM

## 2011-06-05 HISTORY — PX: AMPUTATION: SHX166

## 2011-06-05 LAB — WOUND CULTURE: Special Requests: NORMAL

## 2011-06-05 SURGERY — AMPUTATION DIGIT
Anesthesia: General | Site: Foot | Laterality: Left | Wound class: Dirty or Infected

## 2011-06-05 MED ORDER — LIDOCAINE HCL (CARDIAC) 20 MG/ML IV SOLN
INTRAVENOUS | Status: DC | PRN
  Administered 2011-06-05: 50 mg via INTRAVENOUS

## 2011-06-05 MED ORDER — ONDANSETRON HCL 4 MG PO TABS: 4.0000 mg | ORAL_TABLET | Freq: Four times a day (QID) | ORAL | Status: DC | PRN

## 2011-06-05 MED ORDER — LACTATED RINGERS IV SOLN
INTRAVENOUS | Status: DC | PRN
  Administered 2011-06-05: 17:00:00 via INTRAVENOUS

## 2011-06-05 MED ORDER — METOCLOPRAMIDE HCL 5 MG/ML IJ SOLN
5.0000 mg | Freq: Three times a day (TID) | INTRAMUSCULAR | Status: DC | PRN
Start: 2011-06-05 — End: 2011-06-08

## 2011-06-05 MED ORDER — PROPOFOL 10 MG/ML IV BOLUS
INTRAVENOUS | Status: DC | PRN
  Administered 2011-06-05: 140 mg via INTRAVENOUS

## 2011-06-05 MED ORDER — ENOXAPARIN SODIUM 40 MG/0.4ML ~~LOC~~ SOLN
40.0000 mg | SUBCUTANEOUS | Status: DC
  Administered 2011-06-06: 40 mg via SUBCUTANEOUS
  Filled 2011-06-05 (×2): qty 0.4

## 2011-06-05 MED ORDER — FENTANYL CITRATE 0.05 MG/ML IJ SOLN
25.0000 ug | INTRAMUSCULAR | Status: DC | PRN
  Administered 2011-06-05: 50 ug via INTRAVENOUS

## 2011-06-05 MED ORDER — CHLORHEXIDINE GLUCONATE 4 % EX LIQD
60.0000 mL | Freq: Once | CUTANEOUS | Status: DC
  Filled 2011-06-05: qty 60

## 2011-06-05 MED ORDER — ONDANSETRON HCL 4 MG/2ML IJ SOLN: 4.0000 mg | Freq: Four times a day (QID) | INTRAMUSCULAR | Status: DC | PRN

## 2011-06-05 MED ORDER — FENTANYL CITRATE 0.05 MG/ML IJ SOLN
INTRAMUSCULAR | Status: DC | PRN
  Administered 2011-06-05: 50 ug via INTRAVENOUS
  Administered 2011-06-05 (×2): 25 ug via INTRAVENOUS

## 2011-06-05 MED ORDER — METOCLOPRAMIDE HCL 10 MG PO TABS: 5.0000 mg | ORAL_TABLET | Freq: Three times a day (TID) | ORAL | Status: DC | PRN

## 2011-06-05 MED ORDER — ONDANSETRON HCL 4 MG/2ML IJ SOLN
INTRAMUSCULAR | Status: DC | PRN
  Administered 2011-06-05: 4 mg via INTRAVENOUS

## 2011-06-05 MED ORDER — SODIUM CHLORIDE 0.9 % IV SOLN
INTRAVENOUS | Status: DC
  Administered 2011-06-05 – 2011-06-06 (×2): via INTRAVENOUS

## 2011-06-05 MED ORDER — HYDROMORPHONE HCL PF 1 MG/ML IJ SOLN
0.5000 mg | INTRAMUSCULAR | Status: DC | PRN
  Administered 2011-06-05: 0.5 mg via INTRAVENOUS
  Filled 2011-06-05: qty 1

## 2011-06-05 MED ORDER — LACTATED RINGERS IV SOLN: INTRAVENOUS | Status: DC

## 2011-06-05 SURGICAL SUPPLY — 30 items
BANDAGE ELASTIC 4 VELCRO ST LF (GAUZE/BANDAGES/DRESSINGS) ×1 IMPLANT
BLADE OSCILLATING/SAGITTAL (BLADE) ×2
BLADE SW THK.38XMED LNG THN (BLADE) IMPLANT
BNDG CMPR 9X4 STRL LF SNTH (GAUZE/BANDAGES/DRESSINGS) ×1
BNDG COHESIVE 6X5 TAN STRL LF (GAUZE/BANDAGES/DRESSINGS) ×2 IMPLANT
BNDG ESMARK 4X9 LF (GAUZE/BANDAGES/DRESSINGS) ×2 IMPLANT
CHLORAPREP W/TINT 26ML (MISCELLANEOUS) ×2 IMPLANT
CLOTH BEACON ORANGE TIMEOUT ST (SAFETY) ×2 IMPLANT
CUFF TOURN SGL QUICK 34 (TOURNIQUET CUFF) ×2
CUFF TRNQT CYL 34X4X40X1 (TOURNIQUET CUFF) ×1 IMPLANT
DRAPE U-SHAPE 47X51 STRL (DRAPES) ×2 IMPLANT
DRSG ADAPTIC 3X8 NADH LF (GAUZE/BANDAGES/DRESSINGS) ×2 IMPLANT
DRSG EMULSION OIL 3X16 NADH (GAUZE/BANDAGES/DRESSINGS) ×1 IMPLANT
ELECT REM PT RETURN 9FT ADLT (ELECTROSURGICAL) ×2
ELECTRODE REM PT RTRN 9FT ADLT (ELECTROSURGICAL) ×1 IMPLANT
GAUZE SPONGE 4X4 16PLY XRAY LF (GAUZE/BANDAGES/DRESSINGS) ×1 IMPLANT
GLOVE BIO SURGEON STRL SZ8 (GLOVE) ×4 IMPLANT
GLOVE BIOGEL PI IND STRL 8 (GLOVE) ×1 IMPLANT
GLOVE BIOGEL PI INDICATOR 8 (GLOVE) ×1
GOWN PREVENTION PLUS XLARGE (GOWN DISPOSABLE) ×2 IMPLANT
GOWN STRL NON-REIN LRG LVL3 (GOWN DISPOSABLE) ×2 IMPLANT
KIT BASIN OR (CUSTOM PROCEDURE TRAY) ×2 IMPLANT
MANIFOLD NEPTUNE II (INSTRUMENTS) ×2 IMPLANT
PACK LOWER EXTREMITY WL (CUSTOM PROCEDURE TRAY) ×2 IMPLANT
PAD CAST 4YDX4 CTTN HI CHSV (CAST SUPPLIES) ×1 IMPLANT
PADDING CAST COTTON 4X4 STRL (CAST SUPPLIES) ×2
SPONGE GAUZE 4X4 12PLY (GAUZE/BANDAGES/DRESSINGS) ×2 IMPLANT
SUCTION FRAZIER TIP 10 FR DISP (SUCTIONS) ×2 IMPLANT
SUT ETHILON 3 0 PS 1 (SUTURE) ×2 IMPLANT
TOWEL OR 17X26 10 PK STRL BLUE (TOWEL DISPOSABLE) ×2 IMPLANT

## 2011-06-05 NOTE — Brief Op Note (Signed)
06/02/2011 - 06/05/2011  6:06 PM  PATIENT:  [name]  78 y.o. male  PRE-OPERATIVE DIAGNOSIS:  Left great toe osteomylitis  POST-OPERATIVE DIAGNOSIS:  Left great toe osteomylitis  Procedure(s): L hallux amputation with metatarsal head resection  SURGEON:  Toni Arthurs, MD  ASSISTANT: n/a  ANESTHESIA:   General  EBL:  minimal   TOURNIQUET:  24 min with ankle esmarch  COMPLICATIONS:  None apparent  DISPOSITION:  Extubated, awake and stable to recovery.  DICTATION ID:  [id number]

## 2011-06-05 NOTE — Consult Note (Signed)
WOC consult Note Reason for Consult: Left Great Toe and Left Foot Wound type: Gangrenous abscess of left foot.  I&D a tthe bedside over the weekend by D.r beane, for OR today with Dr. Victorino Dike.  I will not see and will not follow. Pressure Ulcer POA: No Thanks, Ladona Mow, MSN, RN, [facility], [employer]

## 2011-06-05 NOTE — Transfer of Care (Signed)
Immediate Anesthesia Transfer of Care Note  Patient: [name]  Procedure(s) Performed: Procedure(s) (LRB): AMPUTATION DIGIT (Left)  Patient Location: PACU  Anesthesia Type: General  Level of Consciousness: awake and sedated  Airway & Oxygen Therapy: Patient Spontanous Breathing and Patient connected to face mask oxygen  Post-op Assessment: Report given to PACU RN and Post -op Vital signs reviewed and stable  Post vital signs: Reviewed and stable  Complications: No apparent anesthesia complications

## 2011-06-05 NOTE — Consult Note (Signed)
Infectious Diseases Initial Consultation  Reason for Consultation:  Left Hallux osteomyelitis   HPI: [name] is a 78 y.o. male in good state of health who presented to [facility] on 3/22 with 10 day history of worsening left lower leg swelling and erythema. He reports having recent podiatry work done with removal of a corn to his left great toe. He noticed 5 days later having increasing tenderness, redness and swelling to his left leg. He subsequently started oral antibiotics, amox/clav plus cipro, on 05/26/11, concerning for infection by his podiatrist. Upon follow-up with his podiatrist on 3/19, it appeared that his foot did not have considerable improvement of his cellulitis and was referred to go to the hospital for further evaluation. He reported subjective fevers and chills for 3 days prior to admit with Tmax of 100.4. He denies nightsweats, no nausea/vomiting/backpain .He  predominantly came in due to discomfort with weight bearing, swelling and erythema to his left leg, and podiatrist recommendation. On admit he was found to have WBC on upper limits of normal at 9.7, no differential taken at that time .He also underwent a plain xray and MRI of Left foot that suggested he had small abscess as well as bone marrow signal suggestive of osteomyelitis of left hallux. He did have a T max of 100.73F on 06/03/11, but no blood cultures were taken at that time.  He was started initially on vancomycin and pip/tazo and underwent an I X D on 3/24 where the abscess was debrided, toe nail removed, and removal of distal phalynx. Tissue cultures from that surgery revealed MRSA ( oxacillin R, vanco MIC 1). His antibiotics have been narrowed to vancomycin alone on 06/05/11. He is now being evaluated for amputation of left hallux for optimal treatment of osteomyelitis. He has been therapeutic on vancomycin at 2gm daily, and no subsequent fevers. The patient does not have history of peripheral vascular disease nor  diabetes.  Past Medical History  Diagnosis Date  . Pneumonia   - s/p tonsillectomy - s/p hernia repair - s/p repair of tendon for anterior dislocation of Left shoulder - osteoporosis - arthritis - last echocardiogram in 2010  PCP = [facility] at [facility]  Allergies: No Known Allergies  Current antibiotics: Vancomycin 2gm IV daily , started 3/22 Pip/tazo 06/02/11- 06/04/11  Current meds:    . chlorhexidine  60 mL Topical Once  . vancomycin  1,000 mg Intravenous Q12H  . DISCONTD: enoxaparin  40 mg Subcutaneous Q24H    Home meds: Fosamax cipro augmentin Calcium-vitamin d multivitamin  History  Substance Use Topics  . Smoking status: Current Some Day Smoker    Types: Cigars  . Smokeless tobacco: Never Used  . Alcohol Use: No   Social history = is widower since oct 2012, lives alone. Active gardener. Exercises at home. Retired after 36yrs working for [employer]. [place] was in the [employer] [place] Now smokes 1 cigar a day. Occasional wine. No illicit drug use. Patient is self sufficient, would like to do home IV antibiotics and home PT rather than SNF.  History reviewed. No pertinent family history.  Review of Systems  Constitutional: Negative for fever, chills, diaphoresis, activity change, appetite change, fatigue and unexpected weight change.  HENT: Negative for congestion, sore throat, rhinorrhea, sneezing, trouble swallowing and sinus pressure.  Eyes: Negative for photophobia and visual disturbance.  Respiratory: Negative for cough, chest tightness, shortness of breath, wheezing and stridor.  Cardiovascular: Negative for chest pain, palpitations and leg swelling.  Gastrointestinal:  Negative for nausea, vomiting, abdominal pain, diarrhea, constipation, blood in stool, abdominal distention and anal bleeding.  Genitourinary: Negative for dysuria, hematuria, flank pain and difficulty urinating.  Musculoskeletal: Negative for myalgias, back pain. Right leg still  somewhat swollen. Skin: Negative for color change, pallor, rash and wound.  Neurological: Negative for dizziness, tremors, weakness and light-headedness.  Hematological: Negative for adenopathy. Does not bruise/bleed easily.  Psychiatric/Behavioral: Negative for behavioral problems, confusion, sleep disturbance, dysphoric mood, decreased concentration and agitation.    OBJECTIVE: Temp:  [97.4 F (36.3 C)-98.1 F (36.7 C)] 98.1 F (36.7 C) (03/25 0600) Pulse Rate:  [72-86] 72  (03/25 0600) Resp:  [19-20] 19  (03/25 0600) BP: (118-147)/(64-86) 118/64 mmHg (03/25 0600) SpO2:  [91 %-97 %] 94 % (03/25 0600) BP 118/64  Pulse 72  Temp(Src) 98.1 F (36.7 C) (Oral)  Resp 19  Ht 6' (1.829 m)  Wt 171 lb (77.565 kg)  BMI 23.19 kg/m2  SpO2 94%  General Appearance:    Alert, cooperative, no distress, appears stated age  Head:    Normocephalic, without obvious abnormality, atraumatic  Eyes:    PERRL, conjunctiva/corneas clear, EOM's intact, fundi    benign, both eyes       Ears:    Normal TM's and external ear canals, both ears  Nose:   Nares normal, septum midline, mucosa normal, no drainage   or sinus tenderness  Throat:   Lips, mucosa, and tongue normal; teeth and gums normal  Neck:   Supple, symmetrical, trachea midline, no adenopathy;         Back:     Symmetric, no curvature, ROM normal, no CVA tenderness  Lungs:     Clear to auscultation bilaterally, respirations unlabored     Heart:    Regular rate and rhythm, S1 and S2 normal, occ. pvc.no murmur, rub or gallop  Abdomen:     Soft, non-tender, bowel sounds active all four quadrants,    no masses, no organomegaly        Extremities:   Extremities normal, atraumatic, no cyanosis or edema; left foot wrapped from recent surgery, mild blanching erythema to left ankle with + 1 pitting edema > than right leg. Mild arthritic deformity to pointer fingers bilaterally to DIP. No warmth no tenderness.   Pulses:   2+ and symmetric all  extremities  Skin:   Skin color, texture, turgor normal, no rashes or lesions; no splinter hemorrhage, no subconjunctival petechaie.   Lymph nodes:   Cervical, supraclavicular, and axillary nodes normal  Neurologic:   CNII-XII intact. Normal strength, sensation and reflexes      throughout   LABS: Results for orders placed during the hospital encounter of 06/02/11 (from the past 48 hour(s))  CBC     Status: Abnormal   Collection Time   06/04/11  4:50 AM      Component Value Range Comment   WBC 10.3  4.0 - 10.5 (K/uL)    RBC 3.51 (*) 4.22 - 5.81 (MIL/uL)    Hemoglobin 11.0 (*) 13.0 - 17.0 (g/dL)    HCT 47.8 (*) 29.5 - 52.0 (%)    MCV 96.3  78.0 - 100.0 (fL)    MCH 31.3  26.0 - 34.0 (pg)    MCHC 32.5  30.0 - 36.0 (g/dL)    RDW 62.1  30.8 - 65.7 (%)    Platelets 375  150 - 400 (K/uL)   BASIC METABOLIC PANEL     Status: Abnormal   Collection Time   06/04/11  4:50 AM      Component Value Range Comment   Sodium 138  135 - 145 (mEq/L)    Potassium 4.0  3.5 - 5.1 (mEq/L)    Chloride 106  96 - 112 (mEq/L)    CO2 27  19 - 32 (mEq/L)    Glucose, Bld 93  70 - 99 (mg/dL)    BUN 10  6 - 23 (mg/dL)    Creatinine, Ser 1.61  0.50 - 1.35 (mg/dL)    Calcium 8.0 (*) 8.4 - 10.5 (mg/dL)    GFR calc non Af Amer 74 (*) >90 (mL/min)    GFR calc Af Amer 86 (*) >90 (mL/min)   VANCOMYCIN, TROUGH     Status: Normal   Collection Time   06/04/11  3:20 PM      Component Value Range Comment   Vancomycin Tr 15.0  10.0 - 20.0 (ug/mL)     MICRO: 06/03/11 tissue cx: MRSA (oxacillin R, vanco 1,  clinda <0.25 , erythro > 8  , bactrim < 10   , doxy < 1, and rif <0.5  )  IMAGING MRI FOOT 06/02/11: Findings: A large ulceration is identified in the dorsal soft  tissues centered over the proximal phalanx of the great toe. A  fluid collection is seen over the dorsal aspect of the distal  phalanx measuring 1.3 cm transverse by 0.9 cm cranial-caudal by 1.7  cm long. Diffuse and marked subcutaneous edema is present  about  the foot.  There is bone marrow edema throughout the distal phalanx of the  great toe. Also seen is marrow edema in the proximal phalanx of  the great toe. Edema is most intense in the distal 0.7 cm of the  proximal phalanx with mild marrow edema also seen throughout the  diaphysis. Bone marrow signal is otherwise normal.  The patient has small joint effusions at all of the MTP joints. No  mass is identified. No muscle or tendon tear.  IMPRESSION:  1. Large skin ulceration over the dorsum of the great toe. Fluid  collection over the dorsum of the distal phalanx is consistent with  an abscess.  2. Signal abnormality throughout the distal phalanx of the great  toe is consistent with osteomyelitis. Marrow signal abnormality is  also seen in the proximal phalanx of the great toe worst in the  head compatible with osteomyelitis.  3. Diffuse subcutaneous edema over the dorsum of the foot could be  due to cellulitis and/or dependent change.   Assessment/Plan:  78yo M with left hallux soft tissue infection & osteomyelitis due to MRSA s/p I X D on 3/24, currently therapeutic on vancomycin.  -- recommend that the patient to receive at least 4 weeks of IV antibiotics.  -- In patient's who undergo full amputation of the affected area, they generally would only receive "mop-up" < 2wks of antibiotics; however, since we did not collect blood cultures on admit or when he had a fever, it is unclear if he also had MRSA bacteremia. A conservative approach would be to treat presumptively with an extended course of 4-6 wks of vancomycin.  --  Will check blood cultures x 2 today; can place picc line if blood cultures negative.  -- goal of vancomycin trough should be 15-20. On discharge, the patient will need weekly cbc, bmp, and vanco trough.  -- we will check baseline ESR and CRP for tomorrow's labs  -- we will have the patient follow up in the ID clinic in 4-6  wks as part of follow up for IV  antibiotics in treatment of osteomyelitis

## 2011-06-05 NOTE — Progress Notes (Signed)
Patient ID: [name], male   DOB: Jun 25, 1924, 78 y.o.   MRN: [id number]  Subjective: No events overnight. Patient denies chest pain, shortness of breath, abdominal pain. Had bowel movement and reports ambulating.  Objective:  Vital signs in last 24 hours:  Filed Vitals:   06/04/11 1500 06/04/11 2115 06/05/11 0600 06/05/11 1405  BP: 133/76 147/86 118/64 150/82  Pulse: 78 86 72 84  Temp: 97.4 F (36.3 C) 97.9 F (36.6 C) 98.1 F (36.7 C) 97.8 F (36.6 C)  TempSrc: Oral Oral Oral   Resp: 20 19 19 16   Height:      Weight:      SpO2: 97% 91% 94% 96%    Intake/Output from previous day:   Intake/Output Summary (Last 24 hours) at 06/05/11 1451 Last data filed at 06/05/11 0900  Gross per 24 hour  Intake   1120 ml  Output      0 ml  Net   1120 ml    Physical Exam: General: Alert, awake, oriented x3, in no acute distress. HEENT: No bruits, no goiter. Moist mucous membranes, no scleral icterus, no conjunctival pallor. Heart: Regular rate and rhythm, S1/S2 +, no murmurs, rubs, gallops. Lungs: Clear to auscultation bilaterally. No wheezing, no rhonchi, no rales.  Abdomen: Soft, nontender, nondistended, positive bowel sounds. Extremities: Erythema and swelling in left lower extremity improving but toe infection itself with no significant progress Neuro: Grossly nonfocal.  Lab Results:  Basic Metabolic Panel:    Component Value Date/Time   NA 138 06/04/2011 0450   K 4.0 06/04/2011 0450   CL 106 06/04/2011 0450   CO2 27 06/04/2011 0450   BUN 10 06/04/2011 0450   CREATININE 0.94 06/04/2011 0450   GLUCOSE 93 06/04/2011 0450   CALCIUM 8.0* 06/04/2011 0450   CBC:    Component Value Date/Time   WBC 10.3 06/04/2011 0450   HGB 11.0* 06/04/2011 0450   HCT 33.8* 06/04/2011 0450   PLT 375 06/04/2011 0450   MCV 96.3 06/04/2011 0450      Lab 06/04/11 0450 06/03/11 0435 06/02/11 1513  WBC 10.3 9.8 9.7  HGB 11.0* 11.5* 12.1*  HCT 33.8* 34.8* 36.1*  PLT 375 385 361  MCV 96.3 94.8  94.8  MCH 31.3 31.3 31.8  MCHC 32.5 33.0 33.5  RDW 13.2 12.8 12.7  LYMPHSABS -- -- --  MONOABS -- -- --  EOSABS -- -- --  BASOSABS -- -- --  BANDABS -- -- --    Lab 06/04/11 0450 06/03/11 0435 06/02/11 1513  NA 138 130* 135  K 4.0 3.1* 3.5  CL 106 94* 99  CO2 27 27 29   GLUCOSE 93 93 97  BUN 10 10 12   CREATININE 0.94 0.86 0.74  CALCIUM 8.0* 8.5 8.8  MG -- -- 2.1   No results found for this basename: INR:5,PROTIME:5 in the last 168 hours Cardiac markers: No results found for this basename: CK:3,CKMB:3,TROPONINI:3,MYOGLOBIN:3 in the last 168 hours No components found with this basename: POCBNP:3 Recent Results (from the past 240 hour(s))  WOUND CULTURE     Status: Normal   Collection Time   06/03/11 11:15 AM      Component Value Range Status Comment   Specimen Description TOE   Final    Special Requests Normal   Final    Gram Stain     Final    Value: NO WBC SEEN     RARE SQUAMOUS EPITHELIAL CELLS PRESENT     NO ORGANISMS SEEN  Culture     Final    Value: MODERATE METHICILLIN RESISTANT STAPHYLOCOCCUS AUREUS     Note: RIFAMPIN AND GENTAMICIN SHOULD NOT BE USED AS SINGLE DRUGS FOR TREATMENT OF STAPH INFECTIONS. This organism DOES NOT demonstrate inducible Clindamycin resistance in vitro. CRITICAL RESULT CALLED TO, READ BACK BY AND VERIFIED WITH: DANA FRANKLIN      06/05/11 11:25 BY GARRS   Report Status 06/05/2011 FINAL   Final    Organism ID, Bacteria METHICILLIN RESISTANT STAPHYLOCOCCUS AUREUS   Final     Studies/Results: No results found.  Medications: Scheduled Meds:   . chlorhexidine  60 mL Topical Once  . vancomycin  1,000 mg Intravenous Q12H  . DISCONTD: enoxaparin  40 mg Subcutaneous Q24H   Continuous Infusions:   . sodium chloride     PRN Meds:.HYDROcodone-acetaminophen, morphine, ondansetron (ZOFRAN) IV, ondansetron  Assessment/Plan:  Left Lower extremity cellulitis  - slight improvement over 24 hours onantibiotic and now s/p I&D (done 06/03/2011)    - appreciate ortho input  - continue supportive care with analgesia, antibiotics  - keep extremity elevated  - to OR today for amputation as noted per ortho recommendations  Osteomyelitis  - please see the MRI findings noted above  - follow up on ortho recommendations  - continue Vancomycin  Hypokalemia  - supplemented  - BMP in AM   Hyponatremia  - likely pre-renal etiology  - IVF provided  - resolved   DVT Prophylaxis - Lovenox  Code Status - Full   EDUCATION  - test results and diagnostic studies were discussed with patient  - patient verbalized the understanding  - questions were answered at the bedside and contact information was provided for additional questions or concerns    LOS: 3 days   MAGICK-[name] 06/05/2011, 2:51 PM  [facility] Pager: [phone]

## 2011-06-05 NOTE — Anesthesia Postprocedure Evaluation (Signed)
  Anesthesia Post-op Note  Patient: [name]  Procedure(s) Performed: Procedure(s) (LRB): AMPUTATION DIGIT (Left)  Patient Location: PACU  Anesthesia Type: General  Level of Consciousness: awake and alert   Airway and Oxygen Therapy: Patient Spontanous Breathing  Post-op Pain: mild  Post-op Assessment: Post-op Vital signs reviewed, Patient's Cardiovascular Status Stable, Respiratory Function Stable, Patent Airway and No signs of Nausea or vomiting  Post-op Vital Signs: stable  Complications: No apparent anesthesia complications

## 2011-06-05 NOTE — Anesthesia Preprocedure Evaluation (Addendum)
Anesthesia Evaluation  Patient identified by MRN, date of birth, ID band Patient awake    Reviewed: Allergy & Precautions, H&P , NPO status , Patient's Chart, lab work & pertinent test results  Airway Mallampati: II TM Distance: >3 FB Neck ROM: full    Dental  (+) Edentulous Upper and Edentulous Lower   Pulmonary neg pulmonary ROS, pneumonia ,  breath sounds clear to auscultation  Pulmonary exam normal       Cardiovascular Exercise Tolerance: Good negative cardio ROS  Rhythm:regular Rate:Normal     Neuro/Psych negative neurological ROS  negative psych ROS   GI/Hepatic negative GI ROS, Neg liver ROS,   Endo/Other  negative endocrine ROS  Renal/GU negative Renal ROS  negative genitourinary   Musculoskeletal   Abdominal   Peds  Hematology negative hematology ROS (+)   Anesthesia Other Findings   Reproductive/Obstetrics negative OB ROS                          Anesthesia Physical Anesthesia Plan  ASA: II  Anesthesia Plan: General   Post-op Pain Management:    Induction: Intravenous  Airway Management Planned: LMA  Additional Equipment:   Intra-op Plan:   Post-operative Plan:   Informed Consent: I have reviewed the patients History and Physical, chart, labs and discussed the procedure including the risks, benefits and alternatives for the proposed anesthesia with the patient or authorized representative who has indicated his/her understanding and acceptance.   Dental Advisory Given  Plan Discussed with: CRNA and Surgeon  Anesthesia Plan Comments:         Anesthesia Quick Evaluation

## 2011-06-05 NOTE — Progress Notes (Signed)
Positive mrsa wound culture reported per lab 06/05/11

## 2011-06-05 NOTE — Progress Notes (Signed)
Subjective: 78 y/o male admitted for left hallux cellulitis 2 days ago.  Underwent I and D of dorsal toe abscessSaturday.  His MRI and xrays show osteo of hallux distal and proximal phalanges.  He denies f/c/n/v.  He had work done on the toe by Dr. Ralene Cork recently that got infected.  He was treated with oral abx without success.  He c/o mild pain in the toe.  Better withr est and elevation.  Worse with WB.   Objective: Vital signs in last 24 hours: Temp:  [97.4 F (36.3 C)-98.1 F (36.7 C)] 98.1 F (36.7 C) Jun 22, 2022 0600) Pulse Rate:  [72-86] 72  06-22-22 0600) Resp:  [19-20] 19  22-Jun-2022 0600) BP: (118-147)/(64-86) 118/64 mmHg 06/22/22 0600) SpO2:  [91 %-97 %] 94 % 22-Jun-2022 0600)  Intake/Output from previous day: 03/24 0701 - 06-22-2022 0700 In: 1600 [P.O.:1200; IV Piggyback:400] Out: -  Intake/Output this shift: Total I/O In: 240 [P.O.:240] Out: -    Basename 06/04/11 0450 06/03/11 0435 06/02/11 1513  HGB 11.0* 11.5* 12.1*    Basename 06/04/11 0450 06/03/11 0435  WBC 10.3 9.8  RBC 3.51* 3.67*  HCT 33.8* 34.8*  PLT 375 385    Basename 06/04/11 0450 06/03/11 0435  NA 138 130*  K 4.0 3.1*  CL 106 94*  CO2 27 27  BUN 10 10  CREATININE 0.94 0.86  GLUCOSE 93 93  CALCIUM 8.0* 8.5   No results found for this basename: LABPT:2,INR:2 in the last 72 hours  PE:  wn wd male in nad.  A and O x 4.  MOod and affect normal. EOMI.  Respirations unlabored.  L hallux swollen with mild cellulitis.  Dorsally there is skin loss over the proximal phalanx.  Plantarly the skin is in better shape.  DP and PT pulses are 2+.  No lymphadenopathy.  Sens to LT is intact at the forefoot.  5/5 strength in PF and DF of ankle and toes.  Xray:  Distal destruction of the hallux at the tuft.  MRI shows osteo of the distal and proximal phalanges.  Assessment/Plan: L hallux osteomyelitis - to OR today for amputation.  Given the osteo of proximal and distal phalanges and the poor quality of the skin dorsally, I believe  amputation of the toe and the head of the metatarsal is required.  Alternatively he could have chronic suppression with IV and then oral abx.  Cure requires amputation.  The risks and benefits of the alternative treatment options have been discussed in detail.  The patient wishes to proceed with surgery and specifically understands risks of bleeding, infection, nerve damage, blood clots, need for additional surgery, amputation and death.      Toni Arthurs 2011/06/22, 1:00 PM

## 2011-06-06 LAB — CBC
Hemoglobin: 11.3 g/dL — ABNORMAL LOW (ref 13.0–17.0)
MCH: 31.1 pg (ref 26.0–34.0)
MCHC: 31.9 g/dL (ref 30.0–36.0)
Platelets: 407 10*3/uL — ABNORMAL HIGH (ref 150–400)

## 2011-06-06 LAB — BASIC METABOLIC PANEL
Calcium: 8.3 mg/dL — ABNORMAL LOW (ref 8.4–10.5)
GFR calc Af Amer: >90 mL/min (ref >90)
GFR calc non Af Amer: 83 mL/min — ABNORMAL LOW (ref >90)
Glucose, Bld: 90 mg/dL (ref 70–99)
Potassium: 3.6 mEq/L (ref 3.5–5.1)
Sodium: 137 mEq/L (ref 135–145)

## 2011-06-06 LAB — C-REACTIVE PROTEIN: CRP: 3.98 mg/dL — ABNORMAL HIGH (ref <0.60)

## 2011-06-06 LAB — SEDIMENTATION RATE: Sed Rate: 70 mm/hr — ABNORMAL HIGH (ref 0–16)

## 2011-06-06 MED ORDER — METHOCARBAMOL 500 MG PO TABS
500.0000 mg | ORAL_TABLET | Freq: Three times a day (TID) | ORAL | Status: DC | PRN
  Administered 2011-06-06 – 2011-06-07 (×4): 500 mg via ORAL
  Filled 2011-06-06 (×3): qty 1

## 2011-06-06 MED ORDER — VANCOMYCIN HCL IN DEXTROSE 1-5 GM/200ML-% IV SOLN: 1000.0000 mg | Freq: Two times a day (BID) | INTRAVENOUS | Status: DC

## 2011-06-06 MED ORDER — HYDROCODONE-ACETAMINOPHEN 5-325 MG PO TABS: 1.0000 | ORAL_TABLET | ORAL | Status: DC | PRN

## 2011-06-06 MED ORDER — METHOCARBAMOL 500 MG PO TABS: 500.0000 mg | ORAL_TABLET | Freq: Three times a day (TID) | ORAL | Status: AC | PRN

## 2011-06-06 MED FILL — Bacitracin Zinc Oint 500 Unit/GM: CUTANEOUS | Qty: 15 | Status: AC

## 2011-06-06 NOTE — Progress Notes (Signed)
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 Worked with physical therapy this afternoon and had noticed increased bleeding of his foot. Otherwise, he denies fever/chills/nightsweats.interested in rehab after hospitalization.  Objective:  BP 109/56  Pulse 78  Temp(Src) 98.2 F (36.8 C) (Oral)  Resp 14  Ht 6' (1.829 m)  Wt 171 lb (77.565 kg)  BMI 23.19 kg/m2  SpO2 94%  General Appearance:    Alert, cooperative, no distress, appears stated age  Head:    Normocephalic, without obvious abnormality, atraumatic  Eyes:    PERRL, conjunctiva/corneas clear, EOM's intact, fundi    benign, both eyes       Ears:    Normal TM's and external ear canals, both ears  Nose:   Nares normal, septum midline, mucosa normal, no drainage   or sinus tenderness  Throat:   Lips, mucosa, and tongue normal; teeth and gums normal  Neck:   Supple, symmetrical, trachea midline, no adenopathy;           Lungs:     Clear to auscultation bilaterally, respirations unlabored     Heart:    Regular rate and rhythm, S1 and S2 normal, no murmur, rub   or gallop  Abdomen:     Soft, non-tender, bowel sounds active all four quadrants,    no masses, no organomegaly        Extremities:   Left foot bandaged post surgery. Extremities normal, atraumatic, no cyanosis or edema  Pulses:   2+ and symmetric all extremities  Skin:   Skin color, texture, turgor normal, no rashes or lesions; no janeway/osler/splinter hemorrhage  Lymph nodes:   Cervical, supraclavicular, and axillary nodes normal      Lab Results  Basename 06/06/11 0417 06/04/11 0450  WBC 10.5 10.3  HGB 11.3* 11.0*  HCT 35.4* 33.8*  NA 137 138  K 3.6 4.0  CL 102 106  CO2 28 27  BUN 7 10  CREATININE 0.72 0.94  GLU -- --   Sedimentation Rate  Basename 06/06/11 0417  ESRSEDRATE 70*   C-Reactive Protein  Basename  06/06/11 0417  CRP 3.98*    Microbiology:  06/03/11 tissue cx: MRSA (oxacillin R, vanco 1, clinda <0.25 , erythro > 8 , bactrim < 10 , doxy < 1, and rif <0.5 )  06/05/11 blood cx x 2 NGTD/PENDING 06/05/11 tissue PENDING  Assessment/Plan:78yo Male with  MRSA osteomyelitis of left great toe POD #1 s/p left hallux amputation with metatarsal head resection.  -- continue with vancomycin with goal of 15-20 for trough level.  -- will need PICC line to be placed to treat for a total of 4 wks. -- once discharged, will need weekly vancomycin trough level, cbc, and bmp. -- please have patient evaluate for rehab, would be excellent candidate. -- will have the patient follow up in [facility] in 4-5 wks to discontinue antibiotics and pull picc.   -- will sign off, call if questions.  Duke Salvia Drue Second MD MPH [facility] [phone] pager [phone] cell   LOS: 4 days    Judyann Munson 06/06/2011, 3:54 PM

## 2011-06-06 NOTE — Progress Notes (Signed)
Pt's daughter was concerned about some bleeding noted between toes & top of left foot on ace wrap. Reinforced dressing with Kerlix & noted shadowing from it (after PT had worked with pt on ambulation this pm). Dr Izola Price notified & recommended to call Ortho MD about it. Spoke with Rexene Edison, PA for Dr. Lestine Box- on call for Dr. Victorino Dike. States reinforcing was the right thing to do and not to change the original dressing to prevent further infection to it. Explained to pt & dau and they were agreeable to it. Will continue to monitor for any signs of further bleeding.

## 2011-06-06 NOTE — Discharge Instructions (Signed)
Bone and Joint Infections Joint infections are called septic or infectious arthritis. An infected joint may damage cartilage and tissue very quickly. This may destroy the joint. Bone infections (osteomyelitis) may last for years. Joints may become stiff if left untreated. Bacteria are the most common cause. Other causes include viruses and fungi, but these are more rare. Bone and joint infections usually come from injury or infection elsewhere in your body; the germs are carried to your bones or joints through the bloodstream.  CAUSES   Blood-carried germs from an infection elsewhere in your body can eventually spread to a bone or joint. The germ staphylococcus is the most common cause of both osteomyelitis and septic arthritis.   An injury can introduce germs into your bones or joints.  SYMPTOMS   Weight loss.   Tiredness.   Chills and fever.   Bone or joint pain at rest and with activity.   Tenderness when touching the area or bending the joint.   Refusal to bear weight on a leg or inability to use an arm due to pain.   Decreased range of motion in a joint.   Skin redness, warmth, and tenderness.   Open skin sores and drainage.  RISK FACTORS Children, the elderly, and those with weak immune systems are at increased risk of bone and joint infections. It is more common in people with HIV infections and with people on chemotherapy. People are also at increased risk if they have surgery where metal implants are used to stabilize the bone. Plates, screws, or artificial joints provide a surface that bacteria can stick on. Such a growth of bacteria is called biofilm. The biofilm protects bacteria from antibiotics and bodily defenses. This allows germs to multiply. Other reasons for increased risks include:   Having previous surgery or injury of a bone or joint.   Being on high-dose corticosteroids and immunosuppressive medications that weaken your body's resistance to germs.   Diabetes  and long-standing diseases.   Use of intravenous street drugs.   Being on hemodialysis.   Having a history of urinary tract infections.   Removal of your spleen (splenectomy). This weakens your immunity.   Chronic viral infections such as HIV or AIDS.   Lack of sensation such as paraplegia, quadriplegia, or spina bifida.  DIAGNOSIS   Increased numbers of white blood cells in your blood may indicate infection. Some times your caregivers are able to identify the infecting germs by testing your blood. Inflammatory markers present in your bloodstream such as an erythrocyte sedimentation rate (ESR or sed rate) or c-reactive protein (CRP) can be indicators of deep infection.   Bone scans and X-ray exams are necessary for diagnosing osteomyelitis. They may help your caregiver find the infected areas. Other studies may give more detailed information. They may help detect fluid collections around a joint, abnormal bone surfaces, or be useful in diagnosing septic arthritis. They can find soft tissue swelling and find excess fluid in an infected joint or the adjacent bone. These tests include:   Ultrasound.   CT (computerized tomography).   MRI (magnetic resonance imaging).   The best test for diagnosing a bone or joint infection is an aspiration or biopsy. Your caregiver will usually use a local anesthetic. He or she can then remove tissue from a bone injury or use a needle to take fluids from an infected joint. A local anesthetic medication numbs the area to be biopsied. Often biopsies are done in the operating room under general anesthesia. This   means you will be asleep during the procedure. Tests performed on these samples can identify an infection.  TREATMENT   Treatment can help control long-standing infections, but infections may come back.   Infections can infect any bone or joint at any age.   Bone and joint infections are rarely fatal.   Bone infection left untreated can become a  never-ending infection. It can spread to other areas of your body. It may eventually cause bone death. Reduced limb or joint function can result. In severe cases, this may require removal of a limb. Spinal osteomyelitis is very dangerous. Untreated, it may damage spinal nerves and cause death.   The most common complication of septic arthritis is osteoarthritis with pain and decreased range of motion of the joint.  Some forms of treatment may include:  If the infection is caused by bacteria, it is generally treated with antibiotics. You will likely receive the drugs through a vein (intravenously) for anywhere from 2 to 6 weeks. In some cases, especially with children, oral antibiotics following an initial intravenous dose may be effective. The treatment you receive depends on the:   Type of bacteria.   Location of the infection.   Type of surgery that might be done.   Other health conditions or issues you might have.   Your caregiver may drain soft tissue abscesses or pockets of fluid around infected bones or joints. If you have septic arthritis, your caregiver may use a needle to drain pus from the joint on a daily basis. He or she may use an arthroscope to clean the joint or may need to open the joint surgically to remove damaged tissue and infection. An arthroscope is an instrument like a thin lighted telescope. It can be used to look inside the joint.   Surgery is usually needed if the infection has become long-standing. It may also be needed if there is hardware (such as metal plates, screws, or artificial joints) inside the patient. Sometimes a bone or muscle graft is needed to fill in the open space. This promotes growth of new tissues and better blood flow to the area.  PREVENTION   Clean and disinfect wounds quickly to help prevent the start of a bone or joint infection. Get treatment for any infections to prevent spread to a bone or joint.   Do not smoke. Smoking decreases healing  rates of bone and predisposes to infection.   When given medications that suppress your immune system, use them according to your caregiver's instructions. Do not take more than prescribed for your condition.   Take good care of your feet and skin, especially if you have diabetes, decreased sensation or circulation problems.  SEEK IMMEDIATE MEDICAL CARE IF:   You cannot bear weight on a leg or use an arm, especially following a minor injury. This can be a sign of bone or joint infection.   You think you may have signs or symptoms of a bone or joint infection. Your chance of getting rid of an infection is better if treated early.  Document Released: 02/27/2005 Document Revised: 02/16/2011 Document Reviewed: 01/27/2009 [employer] Patient Information 2012 [employer]. 

## 2011-06-06 NOTE — Progress Notes (Signed)
[name] is in good spirits.  I appreciate the care he is getting.  I have encouraged him to do some sort of rehab prior to returning to independent living (he is a very independent gentleman).    He is agreeable to this.  I wonder if inpt. Rehab could be an option versus other (they may have a connection to [facility]).

## 2011-06-06 NOTE — Evaluation (Signed)
Physical Therapy Evaluation Patient Details Name: [name] MRN: [id number] DOB: December 16, 1924 Today's Date: 06/06/2011  Problem List: There is no problem list on file for this patient.   Past Medical History:  Past Medical History  Diagnosis Date  . Pneumonia    Past Surgical History:  Past Surgical History  Procedure Date  . Left  rotator cuff repair   . Tonsillectomy     PT Assessment/Plan/Recommendation PT Assessment Clinical Impression Statement: Patient from home s/p left hallux and distal metatarsal amputation presents with decreased independence with mobility due to pain, decreased balance, decreased knowledge of assistive device and decreased deficit awareness.  He will benefit from skilled PT in the acute setting to maximize safety and allow return to independent after STSNF stay. PT Recommendation/Assessment: Patient will need skilled PT in the acute care venue PT Problem List: Decreased strength;Decreased knowledge of use of DME;Decreased safety awareness;Decreased mobility;Decreased balance;Pain;Decreased activity tolerance PT Therapy Diagnosis : Abnormality of gait;Acute pain PT Plan PT Frequency: Min 3X/week PT Treatment/Interventions: DME instruction;Gait training;Functional mobility training;Therapeutic activities;Therapeutic exercise;Balance training;Patient/family education PT Recommendation Follow Up Recommendations: Skilled nursing facility Equipment Recommended: Defer to next venue PT Goals  Acute Rehab PT Goals PT Goal Formulation: With patient Time For Goal Achievement: 2 weeks Pt will go Supine/Side to Sit: with modified independence PT Goal: Supine/Side to Sit - Progress: Goal set today Pt will go Sit to Stand: with supervision PT Goal: Sit to Stand - Progress: Goal set today Pt will go Stand to Sit: with supervision PT Goal: Stand to Sit - Progress: Goal set today Pt will Stand: with supervision;3 - 5 min;with unilateral upper extremity support  (during functional task) PT Goal: Stand - Progress: Goal set today Pt will Ambulate: 16 - 50 feet;with supervision;with rolling Spidle PT Goal: Ambulate - Progress: Goal set today  PT Evaluation Precautions/Restrictions  Precautions Precautions: Fall Required Braces or Orthoses: Yes Other Brace/Splint: hard bottom shoe on left Restrictions Other Position/Activity Restrictions: WBAT left LE Prior Functioning  Home Living Lives With: Alone Type of Home: House Home Layout: One level Home Access: Stairs to enter Entrance Stairs-Rails: Right Entrance Stairs-Number of Steps: 2 at one entrance 1 at other entrance Home Adaptive Equipment: Desir - rolling Prior Function Level of Independence: Independent with basic ADLs;Independent with transfers;Independent with gait Cognition Cognition Arousal/Alertness: Awake/alert Overall Cognitive Status: Impaired Safety/Judgement: Decreased awareness of safety precautions;Decreased safety judgement for tasks assessed Decreased Safety/Judgement: Decreased awareness of need for assistance Safety/Judgement - Other Comments: not aware of leaning back in standing and needing increased assist initially upon standing Sensation/Coordination   Extremity Assessment RLE Assessment RLE Assessment: Within Functional Limits LLE Assessment LLE Assessment: Exceptions to WFL LLE AROM (degrees) Overall AROM Left Lower Extremity: Within functional limits for tasks assessed (except toes limited due to dressing on left foot) LLE Strength LLE Overall Strength Comments: lifts leg against gravity, not formally tested  Mobility (including Balance) Bed Mobility Bed Mobility: Yes Supine to Sit: 5: Supervision Supine to Sit Details (indicate cue type and reason): with HOB elevated 40* Transfers Transfers: Yes Sit to Stand: 3: Mod assist;With upper extremity assist;From bed Sit to Stand Details (indicate cue type and reason): patient with 2-3 attempts without  success until provided assist Stand to Sit: 4: Min assist;With upper extremity assist;To chair/3-in-1 Stand to Sit Details: cues to reach back to chair Ambulation/Gait Ambulation/Gait: Yes Ambulation/Gait Assistance: 3: Mod assist;4: Min assist Ambulation/Gait Assistance Details (indicate cue type and reason): initially mod assist due to posterior bias, improved  with ambulation, still forward flexed; frequent cues and assist to roll [place], not lift it and for sequence Ambulation Distance (Feet): 20 Feet Assistive device: Rolling Figeroa Gait Pattern: Step-to pattern;Trunk flexed  Posture/Postural Control Posture/Postural Control: Postural limitations Postural Limitations: forward head, rounded shoulders, thoracic kyphosis Exercise    End of Session PT - End of Session Equipment Utilized During Treatment: Gait belt;Other (comment) (and hard bottom shoe) Activity Tolerance: Other (comment) (tolerated well, but foot oozing bloody drainage thru bandage) Patient left: in chair;with call bell in reach Nurse Communication: Other (comment) (dressing reinforced due to bloody drainage) General Behavior During Session: [facility] for tasks performed Cognition: Impaired Cognitive Impairment: decreased deficit awareness  [name],CYNDI 06/06/2011, 4:05 PM

## 2011-06-06 NOTE — Discharge Summary (Addendum)
Patient ID: [name] MRN: [id number] DOB/AGE: 1924-07-03 78 y.o.  Admit date: 06/02/2011 Discharge date: 06/06/2011  Primary Care Physician:  Franklin Kayser, MD, MD  Discharge Diagnoses:  Osteomyelitis of the left great toe, s/p amputation   Medication List  As of 06/06/2011 10:56 PM   STOP taking these medications         amoxicillin-clavulanate 875-125 MG per tablet      ciprofloxacin 500 MG tablet         TAKE these medications         alendronate 70 MG tablet   Commonly known as: FOSAMAX   Take 70 mg by mouth every 7 (seven) days. Take with a full glass of water on an empty stomach.      CALCIUM 600 PO   Take 1 tablet by mouth daily.      HYDROcodone-acetaminophen 5-325 MG per tablet   Commonly known as: NORCO   Take 1-2 tablets by mouth every 4 (four) hours as needed.      methocarbamol 500 MG tablet   Commonly known as: ROBAXIN   Take 1 tablet (500 mg total) by mouth every 8 (eight) hours as needed.      multivitamin Tabs   Take 1 tablet by mouth daily.      multivitamins ther. w/minerals Tabs   Take 1 tablet by mouth daily.      vancomycin 1 GM/200ML Soln   Commonly known as: VANCOCIN   Inject 200 mLs (1,000 mg total) into the vein every 12 (twelve) hours.            Disposition and Follow-up: Pt will need to see PCP in 4 weeks post discharge as well as orthopedic specialist in 2 weeks post discharge to evaluate if the wound is healing. Pt has underwent left great toe amputation and will require Vancomycin antibiotic for 4 weeks therapy. The anticipated stop date is 07/01/2011. He will have an appointment scheduled with ID clinic for follow up. Once discharged, will need weekly vancomycin trough level, cbc, and bmp.  Consults: orthopedics and ID  Significant Diagnostic Studies:  [name] 06/03/2011  IMPRESSION:  1.  Large skin ulceration over the dorsum of the great toe.  Fluid collection over the dorsum of the distal phalanx is  consistent with an abscess. 2.  Signal abnormality throughout the distal phalanx of the great toe is consistent with osteomyelitis.  Marrow signal abnormality is also seen in the proximal phalanx of the great toe worst in the head compatible with osteomyelitis. 3.  Diffuse subcutaneous edema over the dorsum of the foot could be due to cellulitis and/or dependent change.   Dg Foot Complete Left 06/03/2011  IMPRESSION: 1.  Soft tissue defect adjacent to the tuft of the great toe compatible with provided history of I&D.  No radiopaque foreign body.  2.  Osteolysis of the tuft of the great toe worrisome for osteomyelitis as demonstrated on foot MRI.    Brief H and P: Pt is 78 yo male who presents with left lower extremity swelling and erythema that initially started one week prior to admission and has been getting progressively worse. He was given antibiotics but is not sure of the name. He has not had any improvement on that antibiotic. Swelling however has gotten better. He describes subjective fevers and chill, no chest pain or shortness of breath, no other systemic symptoms, no abdominal or urinary concerns. No similar events in the past, as difficulty weight  bearing on the left extremity but can still ambulate. No specific aggravating or alleviating factors.   Physical Exam on Discharge:  Filed Vitals:   06/06/11 0454 06/06/11 1035 06/06/11 1420 06/06/11 2050  BP: 126/71 127/78 109/56 131/65  Pulse: 74 89 78 87  Temp: 97.6 F (36.4 C) 97.7 F (36.5 C) 98.2 F (36.8 C) 98.1 F (36.7 C)  TempSrc: Oral Oral Oral Oral  Resp: 15 14 14 16   Height:      Weight:      SpO2: 97% 94% 94% 97%     Intake/Output Summary (Last 24 hours) at 06/06/11 2256 Last data filed at 06/06/11 1900  Gross per 24 hour  Intake   2045 ml  Output   1850 ml  Net    195 ml    General: Alert, awake, oriented x3, in no acute distress. HEENT: No bruits, no goiter. Heart: Regular rate and rhythm, without murmurs,  rubs, gallops. Lungs: Clear to auscultation bilaterally. Abdomen: Soft, nontender, nondistended, positive bowel sounds. Extremities: No clubbing cyanosis or edema with positive pedal pulses. S/p left great toe amputation but in wrap, was told not to remove the wrap. Swelling in the left lower extremity signficantly improved Neuro: Grossly intact, nonfocal.  CBC:    Component Value Date/Time   WBC 10.5 06/06/2011 0417   HGB 11.3* 06/06/2011 0417   HCT 35.4* 06/06/2011 0417   PLT 407* 06/06/2011 0417   MCV 97.5 06/06/2011 0417    Basic Metabolic Panel:    Component Value Date/Time   NA 137 06/06/2011 0417   K 3.6 06/06/2011 0417   CL 102 06/06/2011 0417   CO2 28 06/06/2011 0417   BUN 7 06/06/2011 0417   CREATININE 0.72 06/06/2011 0417   GLUCOSE 90 06/06/2011 0417   CALCIUM 8.3* 06/06/2011 0417    Hospital Course:   Left Lower extremity cellulitis  - this has significantly improved on Vancomycin and IVF - Pt is now s/p I&D (done 06/03/2011) and s/p L hallux amputation with metatarsal head resection (done 06/05/2011) - continued supportive care with analgesia, antibiotics  - kept extremity elevated  - pt has responded well to the therapy and will require PT for further management  Osteomyelitis  - please see the MRI findings noted above  - now status post L hallux amputation with metatarsal head resection (done 06/05/2011) - will place PICC line and he will need Vancomycin therapy twice daily until stop date 07/01/2011 - he will also need follow up with ID clinic and orthopedic specialist  Hypokalemia  - secondary to poor oral intake - supplemented  - resolved  Hyponatremia  - likely pre-renal etiology  - IVF provided  - resolved   Disposition - plan of care and diagnosis, diagnostic studies and test results were discussed with pt and family at bedside - pt and  family verbalized understanding - pt will be ready for discharge once bed available  - order for inpatient rehab  placed, as well order for PT evaluation - note routed to PCP electronically  Time spent on Discharge: Over 30 minutes  Signed: Debbora Presto 06/06/2011, 10:56 PM  [facility], pager #: [phone] Main office number: [phone]

## 2011-06-07 DIAGNOSIS — E876 Hypokalemia: Secondary | ICD-10-CM | POA: Diagnosis present

## 2011-06-07 DIAGNOSIS — S98119A Complete traumatic amputation of unspecified great toe, initial encounter: Secondary | ICD-10-CM

## 2011-06-07 DIAGNOSIS — M8610 Other acute osteomyelitis, unspecified site: Secondary | ICD-10-CM

## 2011-06-07 DIAGNOSIS — Z22322 Carrier or suspected carrier of Methicillin resistant Staphylococcus aureus: Secondary | ICD-10-CM

## 2011-06-07 DIAGNOSIS — L0390 Cellulitis, unspecified: Secondary | ICD-10-CM | POA: Diagnosis present

## 2011-06-07 DIAGNOSIS — M869 Osteomyelitis, unspecified: Secondary | ICD-10-CM

## 2011-06-07 DIAGNOSIS — E871 Hypo-osmolality and hyponatremia: Secondary | ICD-10-CM | POA: Diagnosis present

## 2011-06-07 HISTORY — DX: Carrier or suspected carrier of methicillin resistant Staphylococcus aureus: Z22.322

## 2011-06-07 HISTORY — DX: Osteomyelitis, unspecified: M86.9

## 2011-06-07 MED ORDER — DEXTROSE 5 % IV SOLN
1.0000 g | Freq: Two times a day (BID) | INTRAVENOUS | Status: DC
  Administered 2011-06-07 – 2011-06-08 (×2): 1 g via INTRAVENOUS
  Filled 2011-06-07 (×3): qty 1

## 2011-06-07 MED ORDER — SODIUM CHLORIDE 0.9 % IJ SOLN: 10.0000 mL | INTRAMUSCULAR | Status: DC | PRN

## 2011-06-07 NOTE — Progress Notes (Signed)
ANTIBIOTIC CONSULT NOTE - FOLLOW UP  Pharmacy Consult for Vancomycin Indication: MRSA Osteomyelitis of L great toe  No Known Allergies  Patient Measurements: Height: 6' (182.9 cm) Weight: 171 lb (77.565 kg) IBW/kg (Calculated) : 77.6    Vital Signs: Temp: 98.8 F (37.1 C) (03/27 0555) Temp src: Oral (03/27 0555) BP: 133/71 mmHg (03/27 0555) Pulse Rate: 75  (03/27 0555) Intake/Output from previous day: 03/26 0701 - 03/27 0700 In: 1650 [P.O.:960; I.V.:290; IV Piggyback:400] Out: 1600 [Urine:1600] Intake/Output from this shift: Total I/O In: 240 [P.O.:240] Out: 550 [Urine:550]  Labs:  [facility] 06/06/11 0417  WBC 10.5  HGB 11.3*  PLT 407*  LABCREA --  CREATININE 0.72   Estimated Creatinine Clearance: 74.1 ml/min (by C-G formula based on Cr of 0.72).  Basename 06/04/11 1520  VANCOTROUGH 15.0  VANCOPEAK --  VANCORANDOM --  GENTTROUGH --  GENTPEAK --  GENTRANDOM --  TOBRATROUGH --  TOBRAPEAK --  TOBRARND --  AMIKACINPEAK --  AMIKACINTROU --  AMIKACIN --     Microbiology: Recent Results (from the past 720 hour(s))  WOUND CULTURE     Status: Normal   Collection Time   06/03/11 11:15 AM      Component Value Range Status Comment   Specimen Description TOE   Final    Special Requests Normal   Final    Gram Stain     Final    Value: NO WBC SEEN     RARE SQUAMOUS EPITHELIAL CELLS PRESENT     NO ORGANISMS SEEN   Culture     Final    Value: MODERATE METHICILLIN RESISTANT STAPHYLOCOCCUS AUREUS     Note: RIFAMPIN AND GENTAMICIN SHOULD NOT BE USED AS SINGLE DRUGS FOR TREATMENT OF STAPH INFECTIONS. This organism DOES NOT demonstrate inducible Clindamycin resistance in vitro. CRITICAL RESULT CALLED TO, READ BACK BY AND VERIFIED WITH: DANA FRANKLIN      06/05/11 11:25 BY GARRS   Report Status 06/05/2011 FINAL   Final    Organism ID, Bacteria METHICILLIN RESISTANT STAPHYLOCOCCUS AUREUS   Final   CULTURE, BLOOD (ROUTINE X 2)     Status: Normal (Preliminary result)   Collection Time   06/05/11  3:50 PM      Component Value Range Status Comment   Specimen Description BLOOD LEFT ARM   Final    Special Requests BOTTLES DRAWN AEROBIC AND ANAEROBIC 5CC   Final    Culture  Setup Time [id number]   Final    Culture     Final    Value:        BLOOD CULTURE RECEIVED NO GROWTH TO DATE CULTURE WILL BE HELD FOR 5 DAYS BEFORE ISSUING A FINAL NEGATIVE REPORT   Report Status PENDING   Incomplete   CULTURE, BLOOD (ROUTINE X 2)     Status: Normal (Preliminary result)   Collection Time   06/05/11  4:00 PM      Component Value Range Status Comment   Specimen Description BLOOD LEFT HAND   Final    Special Requests BOTTLES DRAWN AEROBIC AND ANAEROBIC 5CC   Final    Culture  Setup Time [id number]   Final    Culture     Final    Value:        BLOOD CULTURE RECEIVED NO GROWTH TO DATE CULTURE WILL BE HELD FOR 5 DAYS BEFORE ISSUING A FINAL NEGATIVE REPORT   Report Status PENDING   Incomplete   ANAEROBIC CULTURE     Status: Normal (Preliminary result)  Collection Time   06/05/11  5:39 PM      Component Value Range Status Comment   Specimen Description FOOT   Final    Special Requests NONE   Final    Gram Stain     Final    Value: NO WBC SEEN     NO SQUAMOUS EPITHELIAL CELLS SEEN     NO ORGANISMS SEEN   Culture     Final    Value: NO ANAEROBES ISOLATED; CULTURE IN PROGRESS FOR 5 DAYS   Report Status PENDING   Incomplete   TISSUE CULTURE     Status: Normal (Preliminary result)   Collection Time   06/05/11  5:39 PM      Component Value Range Status Comment   Specimen Description FOOT   Final    Special Requests NONE   Final    Gram Stain     Final    Value: NO WBC SEEN     NO SQUAMOUS EPITHELIAL CELLS SEEN     NO ORGANISMS SEEN   Culture FEW GRAM NEGATIVE RODS   Final    Report Status PENDING   Incomplete     Anti-infectives     Start     Dose/Rate Route Frequency Ordered Stop   06/06/11 0000   vancomycin (VANCOCIN) 1 GM/200ML SOLN        1,000 mg 200 mL/hr  over 60 Minutes Intravenous Every 12 hours 06/06/11 2255 07/01/11 2359   06/03/11 0400   vancomycin (VANCOCIN) IVPB 1000 mg/200 mL premix        1,000 mg 200 mL/hr over 60 Minutes Intravenous Every 12 hours 06/02/11 1607     06/02/11 1600  piperacillin-tazobactam (ZOSYN) IVPB 3.375 g       3.375 g 12.5 mL/hr over 240 Minutes Intravenous  Once 06/02/11 1505 06/02/11 1931   06/02/11 1600   vancomycin (VANCOCIN) IVPB 1000 mg/200 mL premix        1,000 mg 200 mL/hr over 60 Minutes Intravenous  Once 06/02/11 1505 06/02/11 1631   06/02/11 0000   piperacillin-tazobactam (ZOSYN) IVPB 3.375 g  Status:  Discontinued        3.375 g 12.5 mL/hr over 240 Minutes Intravenous Every 8 hours 06/02/11 1607 06/04/11 1155          Assessment:  78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    Vancomycin trough on 3/24 was therapeutic.    4 week course of vancomycin noted. Recommend discharging patient on vancomycin 1 gm q12h with weekly troughs and Scr.  Next trough 4/1  WBC and Scr currently WNL and stable. AF   Goal of Therapy:  Vancomycin trough 15-20  Plan:  1.) Continue vancomycin 1 gm iv q12h  2.) continue to monitor renal function and vancomycin troughs as needed.   Govani Radloff, Loma Messing PharmD 12:42 PM 06/07/2011

## 2011-06-07 NOTE — Op Note (Signed)
[name], [name]               ACCOUNT NO.:  [id number]  MEDICAL RECORD NO.:  [id number]  LOCATION:  1602                         FACILITY:  [facility]  PHYSICIAN:  Toni Arthurs, MD        DATE OF BIRTH:  May 26, 1924  DATE OF PROCEDURE:  06/05/2011 DATE OF DISCHARGE:                              OPERATIVE REPORT   PREOPERATIVE DIAGNOSIS:  Left great toe osteomyelitis.  POSTOPERATIVE DIAGNOSIS:  Left great toe osteomyelitis.  PROCEDURE:  Left hallux amputation with metatarsal head resection.  SURGEON:  Toni Arthurs, MD  ANESTHESIA:  General.  ESTIMATED BLOOD LOSS:  Minimal.  TOURNIQUET TIME:  24 minutes with an ankle Esmarch.  COMPLICATIONS:  None apparent.  DISPOSITION:  Extubated, awake, and stable to recovery.  SPECIMENS:  Left hallux deep tissue to Microbiology for aerobic and anaerobic culture, left hallux to Pathology.  INDICATIONS FOR PROCEDURE:  The patient is an 78 year old male who recently underwent a left hallux procedure by Dr. Ralene Cork at [facility].  The patient then subsequently developed an infection of his hallux, he was treated with antibiotics.  He presented to the hospital approximately 2 days ago with left hallux cellulitis and a draining ulcer from the tip of the toe.  X-rays an MRI revealed an abscess and drainage from the dorsum of the foot.  He underwent operative treatment with I and D of the dorsal aspect of the hallux.  He also has osteomyelitis of the left hallux.  He presents now for operative treatment of this more severe diagnosis, then was initially apparent. He understands the risks and benefits, the alternative treatment options, and elects amputation of the hallux.  He specifically understands risks of bleeding, infection, nerve damage, blood clots, need for additional surgery, revision amputation, and death.  PROCEDURE IN DETAIL:  After preoperative consent was obtained and the correct operative site was identified, the patient was  brought to the operating room and placed supine on the operating table.  General anesthesia was induced.  Preoperative antibiotics were administered. The surgical time-out was taken.  The left lower extremity was prepped and draped in standard sterile fashion.  Foot was exsanguinated and an Esmarch tourniquet was wrapped about the ankle.  A racket type incision was marked on the skin at the base of the hallux.  The incision was made.  Sharp dissection was carried down through the skin and subcutaneous tissue to the level of the toe.  The hallux was disarticulated through the MTP joint and passed off the field to the back table.  Subperiosteal dissection was then carried along the metatarsal shaft.  An oscillating saw was then used to cut through the metatarsal shaft bevelling the cut plantarly and medially.  The wound was then irrigated copiously.  The neurovascular bundles were all cauterized.  There was no evidence of infected tissue remaining. Vertical mattress horizontal mattress simple sutures of 2-0 nylon were then used to close the skin incision.  Sterile dressings were applied followed by a compression wrap.  Tourniquet was released at 24 minutes after application of the dressings.  On the back table, the amputated toe was then incised and deep tissue from the distal phalanx and  the area of abscess were excised and sent as a specimen to Microbiology for aerobic and anaerobic culture.  The hallux was then sent as a specimen to Pathology.  The patient was then awakened from anesthesia and transported to recovery room in stable condition.  FOLLOWUP PLAN:  The patient will be weightbearing as tolerated in a hard sole shoe on his left foot.  Infectious disease consultation will be obtained for antibiotic tailoring.     Toni Arthurs, MD     JH/MEDQ  D:  06/05/2011  T:  06/07/2011  Job:  [id number]  cc:   Alvan Dame, D.P.M. Fax: [phone]

## 2011-06-07 NOTE — Progress Notes (Signed)
Subjective: 2 Days Post-Op Procedure(s) (LRB): AMPUTATION DIGIT (Left) Patient reports pain as moderate.   No n/v/f/c.  Pain controlled with oral pain meds.  Awaiting snf placement.  Objective: Vital signs in last 24 hours: Temp:  [98.1 F (36.7 C)-98.8 F (37.1 C)] 98.8 F (37.1 C) (03/27 0555) Pulse Rate:  [75-87] 75  (03/27 0555) Resp:  [14-16] 16  (03/27 0555) BP: (109-133)/(56-71) 133/71 mmHg (03/27 0555) SpO2:  [94 %-97 %] 96 % (03/27 0555)  Intake/Output from previous day: 03/26 0701 - 03/27 0700 In: 1650 [P.O.:960; I.V.:290; IV Piggyback:400] Out: 1600 [Urine:1600] Intake/Output this shift: Total I/O In: 240 [P.O.:240] Out: 550 [Urine:550]   Basename 06/06/11 0417  HGB 11.3*    Basename 06/06/11 0417  WBC 10.5  RBC 3.63*  HCT 35.4*  PLT 407*    Basename 06/06/11 0417  NA 137  K 3.6  CL 102  CO2 28  BUN 7  CREATININE 0.72  GLUCOSE 90  CALCIUM 8.3*   No results found for this basename: LABPT:2,INR:2 in the last 72 hours  wound bleeding.  no signs of infection  Assessment/Plan: 2 Days Post-Op Procedure(s) (LRB): AMPUTATION DIGIT (Left) Up with therapy Dressing chagned today.  Recommend stopping lovenox to allow for decreased bleeding and decreased risk of infection. Follow up with me in clinic in two weeks.  I'll sign off.  Please call [phone] for f/u applintment.   [name] 06/07/2011, 1:22 PM

## 2011-06-07 NOTE — Progress Notes (Signed)
Spoke with Pt re: d/c plans.  Pt stated that he lives alone and understands that SNF is appropriate for him.  He states, however, that his wife died at [facility], thus he doesn't want to go to this facility.  Pt contacted his son, Casimiro Needle, via phone.  CSW spoke with Casimiro Needle.  Per Casimiro Needle, family interested in [place] and [place], with [facility] being the first choice.  He stated that he spoke with [name] Self yesterday and that there was some question as to how much Pt's insurance would cover per day.  Casimiro Needle stating that the family is able to pay any difference that's not covered by ins.    CSW thanked Pt and Casimiro Needle for their time.  Providence Crosby, LCSWA Clinical Social Work [phone]

## 2011-06-07 NOTE — Progress Notes (Signed)
Physical Therapy Treatment Patient Details Name: [name] MRN: [id number] DOB: 19-Apr-1924 Today's Date: 06/07/2011  PT Assessment/Plan  PT - Assessment/Plan Comments on Treatment Session: pt progressing well today, pain controlled however pt concerned that walking  might exacerbate it PT Plan: Discharge plan remains appropriate;Frequency remains appropriate PT Frequency: Min 3X/week Follow Up Recommendations: Skilled nursing facility Equipment Recommended: Defer to next venue PT Goals  Acute Rehab PT Goals Pt will go Supine/Side to Sit: with modified independence PT Goal: Supine/Side to Sit - Progress: Progressing toward goal Pt will go Sit to Stand: with supervision PT Goal: Sit to Stand - Progress: Progressing toward goal Pt will go Stand to Sit: with supervision PT Goal: Stand to Sit - Progress: Progressing toward goal Pt will Ambulate: 16 - 50 feet;with supervision;with rolling Geng PT Goal: Ambulate - Progress: Progressing toward goal  PT Treatment Precautions/Restrictions  Precautions Precautions: Fall Required Braces or Orthoses: Yes Other Brace/Splint: hard bottom shoe on left Restrictions Other Position/Activity Restrictions: WBAT left LE Mobility (including Balance) Bed Mobility Supine to Sit: 6: Modified independent (Device/Increase time) Transfers Sit to Stand: 4: Min assist Sit to Stand Details (indicate cue type and reason): cues for hand placement and wt shift Stand to Sit: 4: Min assist;With upper extremity assist;To bed Stand to Sit Details: cues for safety and hand placement Ambulation/Gait Ambulation/Gait Assistance: 4: Min assist Ambulation/Gait Assistance Details (indicate cue type and reason): cues for RW use, distance from self, and sequ ence Ambulation Distance (Feet): 80 Feet Assistive device: Rolling [place] Gait Pattern: Step-to pattern;Trunk flexed  Posture/Postural Control Posture/Postural Control: Postural limitations Postural  Limitations: forward head, rounded shoulders, thoracic kyphosis Exercise    End of Session PT - End of Session Activity Tolerance: Patient tolerated treatment well Patient left: in bed;with call bell in reach;with family/visitor present General Behavior During Session: [facility] for tasks performed  [facility] 06/07/2011, 4:50 PM

## 2011-06-07 NOTE — Consult Note (Signed)
Physical Medicine and Rehabilitation Consult Reason for Consult: Left lower extremity cellulitis/left hallux amputation with metatarsal head resection Referring Phsyician: Dr. Daymon Larsen is an 78 y.o. male.   HPI: 78 year old right-handed male admitted March 22 with left lower extremity swelling and erythema that initially started 1 week prior to admission after patient had undergone a recent left hallux procedure at [facility] and progressively worsened with low-grade fever 100.4. Patient had been placed on antibiotic therapy. MRI left foot showed large skin ulceration over the dorsum of the great toe as well as fluid collection over the dorsum of the distal phalanx consistent with abscess. Underwent irrigation and debridement of site 325 per Dr. Victorino Dike. Wound culture noted MRSA with infectious disease consulted placed on vancomycin x4 weeks. Noted slight continued to worsen with findings of osteomyelitis and underwent left hallux amputation with metatarsal head resection March 27 per Dr. Victorino Dike. Postoperative pain management. Patient remains on contact precautions per infectious disease for MRSA the wound. Patient is weightbearing as tolerated to left lower extremity with shoeboot. M.D. request physical medicine rehabilitation consult to consider inpatient rehabilitation services versus skilled nursing facility  Review of Systems  Gastrointestinal: Positive for constipation.  Musculoskeletal: Positive for joint pain.       Left foot pain  Neurological: Positive for tingling.  All other systems reviewed and are negative.   Past Medical History  Diagnosis Date  . Pneumonia    Past Surgical History  Procedure Date  . Left  rotator cuff repair   . Tonsillectomy    History reviewed. No pertinent family history. Social History:  reports that he has been smoking Cigars.  He has never used smokeless tobacco. He reports that he does not drink alcohol or use illicit  drugs. Allergies: No Known Allergies Medications Prior to Admission  Medication Dose Route Frequency Provider Last Rate Last Dose  . 0.9 %  sodium chloride infusion   Intravenous Continuous Dorothea Ogle, MD 50 mL/hr at 06/02/11 1530 50 mL/hr at 06/02/11 1530  . enoxaparin (LOVENOX) injection 40 mg  40 mg Subcutaneous Q24H Toni Arthurs, MD   40 mg at 06/06/11 1612  . HYDROcodone-acetaminophen (NORCO) 5-325 MG per tablet 1-2 tablet  1-2 tablet Oral Q4H PRN Dorothea Ogle, MD   2 tablet at 06/06/11 2130  . HYDROmorphone (DILAUDID) injection 0.5 mg  0.5 mg Intravenous Q2H PRN Toni Arthurs, MD   0.5 mg at 06/05/11 2131  . methocarbamol (ROBAXIN) tablet 500 mg  500 mg Oral Q8H PRN Dorothea Ogle, MD   500 mg at 06/06/11 1734  . metoCLOPramide (REGLAN) tablet 5-10 mg  5-10 mg Oral Q8H PRN Toni Arthurs, MD       Or  . metoCLOPramide (REGLAN) injection 5-10 mg  5-10 mg Intravenous Q8H PRN Toni Arthurs, MD      . morphine 2 MG/ML injection 1 mg  1 mg Intravenous Q4H PRN Dorothea Ogle, MD   1 mg at 06/06/11 1450  . ondansetron (ZOFRAN) tablet 4 mg  4 mg Oral Q6H PRN Dorothea Ogle, MD       Or  . ondansetron [facility]) injection 4 mg  4 mg Intravenous Q6H PRN Dorothea Ogle, MD      . piperacillin-tazobactam (ZOSYN) IVPB 3.375 g  3.375 g Intravenous Once Rollene Fare, PHARMD   3.375 g at 06/02/11 1531  . potassium chloride SA (K-DUR,KLOR-CON) CR tablet 40 mEq  40 mEq Oral BID Dorothea Ogle, MD  40 mEq at 06/03/11 2204  . vancomycin (VANCOCIN) IVPB 1000 mg/200 mL premix  1,000 mg Intravenous Once Rollene Fare, PHARMD   1,000 mg at 06/02/11 1531  . vancomycin (VANCOCIN) IVPB 1000 mg/200 mL premix  1,000 mg Intravenous Q12H Rollene Fare, PHARMD   1,000 mg at 06/07/11 0446  . DISCONTD: 0.9 %  sodium chloride infusion   Intravenous Continuous Toni Arthurs, MD 75 mL/hr at 06/06/11 [phone]    . DISCONTD: chlorhexidine (HIBICLENS) 4 % liquid 4 application  60 mL Topical Once Toni Arthurs, MD      . DISCONTD:  enoxaparin (LOVENOX) injection 30 mg  30 mg Subcutaneous Q24H Dorothea Ogle, MD      . DISCONTD: enoxaparin (LOVENOX) injection 40 mg  40 mg Subcutaneous Q24H Rollene Fare, PHARMD   40 mg at 06/04/11 1735  . DISCONTD: fentaNYL (SUBLIMAZE) injection 25-50 mcg  25-50 mcg Intravenous Q5 min PRN Gaetano Hawthorne, MD   50 mcg at 06/05/11 1820  . DISCONTD: lactated ringers infusion   Intravenous Continuous Gaetano Hawthorne, MD      . DISCONTD: ondansetron (ZOFRAN) injection 4 mg  4 mg Intravenous Q6H PRN Toni Arthurs, MD      . DISCONTD: ondansetron (ZOFRAN) tablet 4 mg  4 mg Oral Q6H PRN Toni Arthurs, MD      . DISCONTD: piperacillin-tazobactam (ZOSYN) IVPB 3.375 g  3.375 g Intravenous Q8H Rollene Fare, PHARMD   3.375 g at 06/04/11 0831   Medications Prior to Admission  Medication Sig Dispense Refill  . HYDROcodone-acetaminophen (NORCO) 5-325 MG per tablet Take 1-2 tablets by mouth every 4 (four) hours as needed.  65 tablet  0  . methocarbamol (ROBAXIN) 500 MG tablet Take 1 tablet (500 mg total) by mouth every 8 (eight) hours as needed.  45 tablet  0  . vancomycin (VANCOCIN) 1 GM/200ML SOLN Inject 200 mLs (1,000 mg total) into the vein every 12 (twelve) hours.  4000 mL  0    Home: Home Living Lives With: Alone Type of Home: House Home Layout: One level Home Access: Stairs to enter Entrance Stairs-Rails: Right Entrance Stairs-Number of Steps: 2 at one entrance 1 at other entrance Home Adaptive Equipment: Castilla - rolling  Functional History: Prior Function Level of Independence: Independent with basic ADLs;Independent with transfers;Independent with gait Functional Status:  Mobility: Bed Mobility Bed Mobility: Yes Supine to Sit: 5: Supervision Supine to Sit Details (indicate cue type and reason): with HOB elevated 40* Transfers Transfers: Yes Sit to Stand: 3: Mod assist;With upper extremity assist;From bed Sit to Stand Details (indicate cue type and reason): patient with 2-3  attempts without success until provided assist Stand to Sit: 4: Min assist;With upper extremity assist;To chair/3-in-1 Stand to Sit Details: cues to reach back to chair Ambulation/Gait Ambulation/Gait: Yes Ambulation/Gait Assistance: 3: Mod assist;4: Min assist Ambulation/Gait Assistance Details (indicate cue type and reason): initially mod assist due to posterior bias, improved with ambulation, still forward flexed; frequent cues and assist to roll [place], not lift it and for sequence Ambulation Distance (Feet): 20 Feet Assistive device: Rolling Prinsen Gait Pattern: Step-to pattern;Trunk flexed    ADL:    Cognition: Cognition Arousal/Alertness: Awake/alert Cognition Arousal/Alertness: Awake/alert Overall Cognitive Status: Impaired Safety/Judgement: Decreased awareness of safety precautions;Decreased safety judgement for tasks assessed Decreased Safety/Judgement: Decreased awareness of need for assistance Safety/Judgement - Other Comments: not aware of leaning back in standing and needing increased assist initially upon standing  Blood pressure 133/71, pulse 75, temperature  98.8 F (37.1 C), temperature source Oral, resp. rate 16, height 6' (1.829 m), weight 77.565 kg (171 lb), SpO2 96.00%. Physical Exam  Constitutional: He is oriented to person, place, and time. He appears well-developed.  HENT:  Head: Normocephalic.  Neck: Normal range of motion. Neck supple. No thyromegaly present.  Cardiovascular: Normal rate.   Pulmonary/Chest: Breath sounds normal. He has no wheezes.  Abdominal: He exhibits no distension. There is no tenderness.  Musculoskeletal:       Left foot wrapped. Surrounding tissue red and warm.  Left foot extremely tender to touch.  i did not motor test the foot due to pain. '  Mild RTC signs at left shoulder  Neurological: He is alert and oriented to person, place, and time. No cranial nerve deficit or sensory deficit.       3 limbs tested (not left leg) and  motor function grossly intact  Skin:       Foot surgical dressing in place  Psychiatric: He has a normal mood and affect. His behavior is normal. Judgment and thought content normal.    No results found for this or any previous visit (from the past 24 hour(s)). No results found.  Assessment/Plan: Diagnosis: left hallux and MT amputations due to osteo 1. Does the need for close, 24 hr/day medical supervision in concert with the patient's rehab needs make it unreasonable for this patient to be served in a less intensive setting? No 2. Co-Morbidities requiring supervision/potential complications: see above 3. Due to bladder management, bowel management, skin/wound care and pain management, does the patient require 24 hr/day rehab nursing? Potentially 4. Does the patient require coordinated care of a physician, rehab nurse, PT and OT to address physical and functional deficits in the context of the above medical diagnosis(es)? No Addressing deficits in the following areas: balance, strength, transferring and dressing 5. Can the patient actively participate in an intensive therapy program of at least 3 hrs of therapy per day at least 5 days per week? No 6. The potential for patient to make measurable gains while on inpatient rehab is fair 7. Anticipated functional outcomes upon discharge from inpatients are not applicable 8. Estimated rehab length of stay to reach the above functional goals is: n/a 9. Does the patient have adequate social supports to accommodate these discharge functional goals? No 10. Anticipated D/C setting: Home 11. Anticipated post D/C treatments: other 12. Overall Rehab/Functional Prognosis: good  RECOMMENDATIONS: This patient's condition is appropriate for continued rehabilitative care in the following setting: SNF Patient has agreed to participate in recommended program. Yes Note that insurance prior authorization may be required for reimbursement for recommended  care.  Comment: Given his pain issues, social supports, and lack of medical necessity, SNF is the most appropriate option.   Ivory Broad, MD 06/07/2011

## 2011-06-07 NOTE — Progress Notes (Signed)
Spoke with [name].  [name] had questions regarding what Pt's insurance will cover.  CSW encouraged [name], Mr. Frayre, to contact his father's ins company to discuss out-of-pocket costs for out-of-network facilities.  Mr. Kroon stated that he doesn't have Pt's insurance card with him.  [name] is out-of-state but will contact his sister, who is visiting with Pt today, and ask that she call the number on the back of Pt's insurance card to learn this information.  Mr. Halls stating that they'd like the bed at [facility] for Pt.  Spoke with [employer] at [place].  Apprised her of the situation.  [name] stating that they have all they need on Pt via [facility] and are anticipating that he be d/c'd to their facility tomorrow.  CSW to continue to follow.  Providence Crosby, LCSWA Clinical Social Work [phone]

## 2011-06-07 NOTE — Progress Notes (Signed)
PROGRESS NOTE  [name] ZOX:[id number] DOB: [birth year] DOA: 06/02/2011 PCP: Ezequiel Kayser, MD, MD  Brief narrative: [name] is an 78 year old man who was admitted on 06/02/2011 with left lower extremity cellulitis and MRI proven osteomyelitis of the left great toe. He initially underwent incision and drainage on 06/03/2011. He subsequently underwent a left hallux amputation with metatarsal head resection on 06/05/2011. Wound cultures grew MRSA. The patient was subsequently seen by the infectious disease team and recommendations were made for at least 4 weeks of IV antibiotics with vancomycin. A PICC line was placed today for prolonged antibiotic administration.  Assessment/Plan: Principal Problem:  *Osteomyelitis of toe of left foot /  Cellulitis / MRSA culture positive The patient was admitted and a diagnostic evaluation revealed osteomyelitis. He was seen by the orthopedic surgeons who initially took him for an incision and drainage on 06/02/2011. This was followed by a left hallux amputation with metatarsal head resection on 06/05/2011. Wound cultures did grow MRSA and therefore the infectious disease consultants saw the patient and recommended a minimum of 4 weeks of IV antibiotic therapy with vancomycin. A PICC line has been placed for IV antibiotic administration with the plans to discharge him to a rehabilitation facility for ongoing antibiotic therapy and rehabilitation. Active Problems:  Hypokalemia The patient's potassium has been corrected.  Hyponatremia The patient's sodium has been corrected.   Code Status: Full code Family Communication: Geryl Councilman Daughter [phone], message left for daughter on answering machine on 06/07/2011. Disposition Plan: Skilled nursing home placement for rehabilitation  Consultants:  Dr. Jene Every, Orthopedics  Blane Ohara, WOC  Dr. Judyann Munson, ID  Dr. Faith Rogue, Physical Medicine and Rehabilitation    Procedures:  06/02/2011: 1.  Irrigation and debridement of left great toe with removal of the entire nail. 2. Debridement of the distal phalanx of the great toe with partial removal of the distal phalanx of great toe. 3. Irrigation and debridement and drainage of abscess of the dorsum of the great toe with open packing.  06/05/11: Left hallux amputation with metatarsal head resection.  Antibiotics:  Vancomycin 06/02/2011--->  Zosyn 06/02/2011---> 06/04/2011   Subjective  [name] feels well. He is still having some operative wound discomfort, but reports that the pain medication is successful in alleviating his pain. His appetite is good. No nausea or vomiting.   Objective    Interim History: Stable overnight. PICC line placed today.   Objective: Filed Vitals:   06/06/11 1035 06/06/11 1420 06/06/11 2050 06/07/11 0555  BP: 127/78 109/56 131/65 133/71  Pulse: 89 78 87 75  Temp: 97.7 F (36.5 C) 98.2 F (36.8 C) 98.1 F (36.7 C) 98.8 F (37.1 C)  TempSrc: Oral Oral Oral Oral  Resp: 14 14 16 16   Height:      Weight:      SpO2: 94% 94% 97% 96%    Intake/Output Summary (Last 24 hours) at 06/07/11 1306 Last data filed at 06/07/11 1145  Gross per 24 hour  Intake   1410 ml  Output   1800 ml  Net   -390 ml    Exam: Gen:  NAD Cardiovascular:  RRR, No M/R/G Respiratory: Lungs CTAB Gastrointestinal: Abdomen soft, NT/ND with normal active bowel sounds. Extremities: No C/E/C, venous stasis dermatitis noted bilaterally. Left lower extremity wound wrapped in Ace wrap.   Data Reviewed: Basic Metabolic Panel:  Lab 06/06/11 8469 06/04/11 0450 06/03/11 0435 06/02/11 1513  NA 137 138 130* 135  K 3.6 4.0 -- --  CL 102 106 94* 99  CO2 28 27 27 29   GLUCOSE 90 93 93 97  BUN 7 10 10 12   CREATININE 0.72 0.94 0.86 0.74  CALCIUM 8.3* 8.0* 8.5 8.8  MG -- -- -- 2.1  PHOS -- -- -- 3.0   GFR Estimated Creatinine Clearance: 74.1 ml/min (by C-G formula based on Cr of 0.72). Liver  Function Tests:  Lab 06/02/11 1513  AST 35  ALT 38  ALKPHOS 85  BILITOT 0.4  PROT 6.7  ALBUMIN 2.5*    CBC:  Lab 06/06/11 0417 06/04/11 0450 06/03/11 0435 06/02/11 1513  WBC 10.5 10.3 9.8 9.7  NEUTROABS -- -- -- --  HGB 11.3* 11.0* 11.5* 12.1*  HCT 35.4* 33.8* 34.8* 36.1*  MCV 97.5 96.3 94.8 94.8  PLT 407* 375 385 361   Microbiology Recent Results (from the past 240 hour(s))  WOUND CULTURE     Status: Normal   Collection Time   06/03/11 11:15 AM      Component Value Range Status Comment   Specimen Description TOE   Final    Special Requests Normal   Final    Gram Stain     Final    Value: NO WBC SEEN     RARE SQUAMOUS EPITHELIAL CELLS PRESENT     NO ORGANISMS SEEN   Culture     Final    Value: MODERATE METHICILLIN RESISTANT STAPHYLOCOCCUS AUREUS     Note: RIFAMPIN AND GENTAMICIN SHOULD NOT BE USED AS SINGLE DRUGS FOR TREATMENT OF STAPH INFECTIONS. This organism DOES NOT demonstrate inducible Clindamycin resistance in vitro. CRITICAL RESULT CALLED TO, READ BACK BY AND VERIFIED WITH: DANA FRANKLIN      06/05/11 11:25 BY GARRS   Report Status 06/05/2011 FINAL   Final    Organism ID, Bacteria METHICILLIN RESISTANT STAPHYLOCOCCUS AUREUS   Final   CULTURE, BLOOD (ROUTINE X 2)     Status: Normal (Preliminary result)   Collection Time   06/05/11  3:50 PM      Component Value Range Status Comment   Specimen Description BLOOD LEFT ARM   Final    Special Requests BOTTLES DRAWN AEROBIC AND ANAEROBIC 5CC   Final    Culture  Setup Time [id number]   Final    Culture     Final    Value:        BLOOD CULTURE RECEIVED NO GROWTH TO DATE CULTURE WILL BE HELD FOR 5 DAYS BEFORE ISSUING A FINAL NEGATIVE REPORT   Report Status PENDING   Incomplete   CULTURE, BLOOD (ROUTINE X 2)     Status: Normal (Preliminary result)   Collection Time   06/05/11  4:00 PM      Component Value Range Status Comment   Specimen Description BLOOD LEFT HAND   Final    Special Requests BOTTLES DRAWN AEROBIC AND  ANAEROBIC 5CC   Final    Culture  Setup Time [id number]   Final    Culture     Final    Value:        BLOOD CULTURE RECEIVED NO GROWTH TO DATE CULTURE WILL BE HELD FOR 5 DAYS BEFORE ISSUING A FINAL NEGATIVE REPORT   Report Status PENDING   Incomplete   ANAEROBIC CULTURE     Status: Normal (Preliminary result)   Collection Time   06/05/11  5:39 PM      Component Value Range Status Comment   Specimen Description FOOT   Final    Special Requests NONE  Final    Gram Stain     Final    Value: NO WBC SEEN     NO SQUAMOUS EPITHELIAL CELLS SEEN     NO ORGANISMS SEEN   Culture     Final    Value: NO ANAEROBES ISOLATED; CULTURE IN PROGRESS FOR 5 DAYS   Report Status PENDING   Incomplete   TISSUE CULTURE     Status: Normal (Preliminary result)   Collection Time   06/05/11  5:39 PM      Component Value Range Status Comment   Specimen Description FOOT   Final    Special Requests NONE   Final    Gram Stain     Final    Value: NO WBC SEEN     NO SQUAMOUS EPITHELIAL CELLS SEEN     NO ORGANISMS SEEN   Culture FEW GRAM NEGATIVE RODS   Final    Report Status PENDING   Incomplete     Studies:  [name] 06/03/2011 IMPRESSION:  1.  Large skin ulceration over the dorsum of the great toe.  Fluid collection over the dorsum of the distal phalanx is consistent with an abscess. 2.  Signal abnormality throughout the distal phalanx of the great toe is consistent with osteomyelitis.  Marrow signal abnormality is also seen in the proximal phalanx of the great toe worst in the head compatible with osteomyelitis. 3.  Diffuse subcutaneous edema over the dorsum of the foot could be due to cellulitis and/or dependent change.  Original Report Authenticated By: Bernadene Bell. D'ALESSIO, M.D.    Dg Foot Complete Left 06/03/2011 IMPRESSION: 1.  Soft tissue defect adjacent to the tuft of the great toe compatible with provided history of I&D.  No radiopaque foreign body.  2.  Osteolysis of the tuft of the great  toe worrisome for osteomyelitis as demonstrated on foot MRI.  Original Report Authenticated By: Waynard Reeds, M.D.    Scheduled Meds:    . enoxaparin  40 mg Subcutaneous Q24H  . vancomycin  1,000 mg Intravenous Q12H   Continuous Infusions:     LOS: 5 days   Hillery Aldo, MD Pager [phone]  06/07/2011, 1:06 PM

## 2011-06-07 NOTE — Progress Notes (Signed)
Spoke with [name] at [place].  Per [name], Pt's insurance is out-of-network and that her experience has been that the out-of-pocket cost is $190 per day.  She stated, however, that all policies are different and encouraged [name] to contact the insurance company directly to inquire about out-of-pocket costs.   Additionally, [name] stated that she only has 1 male bed available, at this time, and needs to know from family, within the hour, if they want this bed.  CSW left messages for [name], [name], at [phone] and [phone].  CSW to continue to follow.  Providence Crosby, LCSWA Clinical Social Work [phone]

## 2011-06-08 ENCOUNTER — Encounter (HOSPITAL_COMMUNITY): Payer: Self-pay | Admitting: Internal Medicine

## 2011-06-08 LAB — TISSUE CULTURE

## 2011-06-08 LAB — BASIC METABOLIC PANEL
Chloride: 100 mEq/L (ref 96–112)
Creatinine, Ser: 0.81 mg/dL (ref 0.50–1.35)
GFR calc Af Amer: >90 mL/min (ref >90)
GFR calc non Af Amer: 79 mL/min — ABNORMAL LOW (ref >90)
Potassium: 4.3 mEq/L (ref 3.5–5.1)

## 2011-06-08 MED ORDER — HEPARIN SOD (PORK) LOCK FLUSH 100 UNIT/ML IV SOLN
250.0000 [IU] | INTRAVENOUS | Status: AC | PRN
  Administered 2011-06-08: 250 [IU]

## 2011-06-08 MED ORDER — HYDROCODONE-ACETAMINOPHEN 5-325 MG PO TABS: 1.0000 | ORAL_TABLET | ORAL | Status: AC | PRN

## 2011-06-08 MED ORDER — DEXTROSE 5 % IV SOLN: 2.0000 g | INTRAVENOUS | Status: DC

## 2011-06-08 MED ORDER — VANCOMYCIN HCL IN DEXTROSE 1-5 GM/200ML-% IV SOLN: 1000.0000 mg | Freq: Two times a day (BID) | INTRAVENOUS | Status: AC

## 2011-06-08 MED ORDER — DEXTROSE 5 % IV SOLN
2.0000 g | INTRAVENOUS | Status: DC
  Administered 2011-06-08: 2 g via INTRAVENOUS
  Filled 2011-06-08: qty 2

## 2011-06-08 NOTE — Progress Notes (Signed)
ID PROGRESS NOTE  Patient ID: [name], male   DOB: Jan 16, 1925, 78 y.o.   MRN: [id number]  Micro = klebseilla pneumoniae and MRSA on tissue cx  abtx recs =  1) discontinue cefepime and start ceftriaxone 2gm IV daily for 2 wks 2) continue with vancomycin; we will check vancomycin trough at 1500 prior to next dose 3) please continue vancomycin for 4 wks. 4) we will have patient follow up in [facility] with myself in 2 wks.  Duke Salvia Drue Second MD MPH [facility] [phone]

## 2011-06-08 NOTE — Discharge Summary (Signed)
Physician Discharge Summary  Patient ID: [name] MRN: [id number] DOB/AGE: 1924/11/09 78 y.o.  Admit date: 06/02/2011 Discharge date: 06/08/2011  Primary Care Physician:  Ezequiel Kayser, MD, MD Orthopedic Surgeon: Dr. Toni Arthurs   Discharge Diagnoses:    Present on Admission:  .Osteomyelitis of toe of left foot .Cellulitis .Hypokalemia .Hyponatremia  Discharge Medications:  Medication List  As of 06/08/2011 12:30 PM   STOP taking these medications         amoxicillin-clavulanate 875-125 MG per tablet      ciprofloxacin 500 MG tablet         TAKE these medications         alendronate 70 MG tablet   Commonly known as: FOSAMAX   Take 70 mg by mouth every 7 (seven) days. Take with a full glass of water on an empty stomach.      CALCIUM 600 PO   Take 1 tablet by mouth daily.      dextrose 5 % SOLN 50 mL with cefTRIAXone 2 G SOLR 2 g   Inject 2 g into the vein daily. X 2 weeks      HYDROcodone-acetaminophen 5-325 MG per tablet   Commonly known as: NORCO   Take 1-2 tablets by mouth every 4 (four) hours as needed.      methocarbamol 500 MG tablet   Commonly known as: ROBAXIN   Take 1 tablet (500 mg total) by mouth every 8 (eight) hours as needed.      multivitamin Tabs   Take 1 tablet by mouth daily.      multivitamins ther. w/minerals Tabs   Take 1 tablet by mouth daily.      vancomycin 1 GM/200ML Soln   Commonly known as: VANCOCIN   Inject 200 mLs (1,000 mg total) into the vein every 12 (twelve) hours. X 4 weeks.             Disposition and Follow-up: The patient is being discharged to a SNF.  He should follow up with Dr. Victorino Dike in 2 weeks.   Medical Consults:  Dr. Toni Arthurs and Dr. Jene Every, Orthopedics  Dr. Judyann Munson, ID  Dr. Faith Rogue, Physical Medicine and Rehabilitation   Other Consults:  Blane Ohara, [facility]  Physical Therapy  Significant Diagnostic Studies:   [name] 06/03/2011 IMPRESSION:  1.   Large skin ulceration over the dorsum of the great toe.  Fluid collection over the dorsum of the distal phalanx is consistent with an abscess. 2.  Signal abnormality throughout the distal phalanx of the great toe is consistent with osteomyelitis.  Marrow signal abnormality is also seen in the proximal phalanx of the great toe worst in the head compatible with osteomyelitis. 3.  Diffuse subcutaneous edema over the dorsum of the foot could be due to cellulitis and/or dependent change.  Original Report Authenticated By: Bernadene Bell. D'ALESSIO, M.D.    Dg Foot Complete Left 06/03/2011 IMPRESSION: 1.  Soft tissue defect adjacent to the tuft of the great toe compatible with provided history of I&D.  No radiopaque foreign body.  2.  Osteolysis of the tuft of the great toe worrisome for osteomyelitis as demonstrated on foot MRI.  Original Report Authenticated By: Waynard Reeds, M.D.   Procedures:   06/02/2011: 1. Irrigation and debridement of left great toe with removal of the entire nail. 2. Debridement of the distal phalanx of the great toe with partial removal of the distal phalanx of great toe. 3.  Irrigation and debridement and drainage of abscess of the dorsum of the great toe with open packing.  06/05/11: Left hallux amputation with metatarsal head resection.   Discharge Laboratory Values: Basic Metabolic Panel:  Lab 06/08/11 9604 06/06/11 0417 06/04/11 0450 06/03/11 0435 06/02/11 1513  NA 136 137 138 130* 135  K 4.3 3.6 -- -- --  CL 100 102 106 94* 99  CO2 30 28 27 27 29   GLUCOSE 110* 90 93 93 97  BUN 10 7 10 10 12   CREATININE 0.81 0.72 0.94 0.86 0.74  CALCIUM 8.7 8.3* 8.0* 8.5 8.8  MG -- -- -- -- 2.1  PHOS -- -- -- -- 3.0   GFR Estimated Creatinine Clearance: 73.2 ml/min (by C-G formula based on Cr of 0.81). Liver Function Tests:  Lab 06/02/11 1513  AST 35  ALT 38  ALKPHOS 85  BILITOT 0.4  PROT 6.7  ALBUMIN 2.5*    CBC:  Lab 06/06/11 0417 06/04/11 0450 06/03/11 0435 06/02/11  1513  WBC 10.5 10.3 9.8 9.7  NEUTROABS -- -- -- --  HGB 11.3* 11.0* 11.5* 12.1*  HCT 35.4* 33.8* 34.8* 36.1*  MCV 97.5 96.3 94.8 94.8  PLT 407* 375 385 361   Microbiology Recent Results (from the past 240 hour(s))  WOUND CULTURE     Status: Normal   Collection Time   06/03/11 11:15 AM      Component Value Range Status Comment   Specimen Description TOE   Final    Special Requests Normal   Final    Gram Stain     Final    Value: NO WBC SEEN     RARE SQUAMOUS EPITHELIAL CELLS PRESENT     NO ORGANISMS SEEN   Culture     Final    Value: MODERATE METHICILLIN RESISTANT STAPHYLOCOCCUS AUREUS     Note: RIFAMPIN AND GENTAMICIN SHOULD NOT BE USED AS SINGLE DRUGS FOR TREATMENT OF STAPH INFECTIONS. This organism DOES NOT demonstrate inducible Clindamycin resistance in vitro. CRITICAL RESULT CALLED TO, READ BACK BY AND VERIFIED WITH: DANA FRANKLIN      06/05/11 11:25 BY GARRS   Report Status 06/05/2011 FINAL   Final    Organism ID, Bacteria METHICILLIN RESISTANT STAPHYLOCOCCUS AUREUS   Final   CULTURE, BLOOD (ROUTINE X 2)     Status: Normal (Preliminary result)   Collection Time   06/05/11  3:50 PM      Component Value Range Status Comment   Specimen Description BLOOD LEFT ARM   Final    Special Requests BOTTLES DRAWN AEROBIC AND ANAEROBIC 5CC   Final    Culture  Setup Time [id number]   Final    Culture     Final    Value:        BLOOD CULTURE RECEIVED NO GROWTH TO DATE CULTURE WILL BE HELD FOR 5 DAYS BEFORE ISSUING A FINAL NEGATIVE REPORT   Report Status PENDING   Incomplete   CULTURE, BLOOD (ROUTINE X 2)     Status: Normal (Preliminary result)   Collection Time   06/05/11  4:00 PM      Component Value Range Status Comment   Specimen Description BLOOD LEFT HAND   Final    Special Requests BOTTLES DRAWN AEROBIC AND ANAEROBIC 5CC   Final    Culture  Setup Time [id number]   Final    Culture     Final    Value:        BLOOD CULTURE RECEIVED NO GROWTH TO  DATE CULTURE WILL BE HELD FOR 5  DAYS BEFORE ISSUING A FINAL NEGATIVE REPORT   Report Status PENDING   Incomplete   ANAEROBIC CULTURE     Status: Normal (Preliminary result)   Collection Time   06/05/11  5:39 PM      Component Value Range Status Comment   Specimen Description FOOT   Final    Special Requests NONE   Final    Gram Stain     Final    Value: NO WBC SEEN     NO SQUAMOUS EPITHELIAL CELLS SEEN     NO ORGANISMS SEEN   Culture     Final    Value: NO ANAEROBES ISOLATED; CULTURE IN PROGRESS FOR 5 DAYS   Report Status PENDING   Incomplete   TISSUE CULTURE     Status: Normal   Collection Time   06/05/11  5:39 PM      Component Value Range Status Comment   Specimen Description FOOT   Final    Special Requests NONE   Final    Gram Stain     Final    Value: NO WBC SEEN     NO SQUAMOUS EPITHELIAL CELLS SEEN     NO ORGANISMS SEEN   Culture FEW KLEBSIELLA PNEUMONIAE   Final    Report Status 06/08/2011 FINAL   Final    Organism ID, Bacteria KLEBSIELLA PNEUMONIAE   Final      Brief H and P: For complete details please refer to admission H and P, but in brief, [name] is an 78 year old man who was admitted on 06/02/2011 with left lower extremity cellulitis and MRI proven osteomyelitis of the left great toe.   Physical Exam at Discharge: BP 128/75  Pulse 81  Temp(Src) 99.2 F (37.3 C) (Oral)  Resp 16  Ht 6' (1.829 m)  Wt 77.565 kg (171 lb)  BMI 23.19 kg/m2  SpO2 95% Gen:  NAD Cardiovascular:  RRR, No M/R/G Respiratory: Lungs CTAB Gastrointestinal: Abdomen soft, NT/ND with normal active bowel sounds. Extremities: No C/E/C, LLE dressing clean, dry, intact.   Hospital Course:  Principal Problem:  *Osteomyelitis of toe of left foot / Cellulitis / MRSA culture positive  The patient was admitted and a diagnostic evaluation revealed osteomyelitis. He was seen by the orthopedic surgeons who initially took him for an incision and drainage on 06/02/2011. This was followed by a left hallux amputation with  metatarsal head resection on 06/05/2011. Wound cultures did grow MRSA and Klebsiella Pneumoniae and therefore the infectious disease consultants saw the patient and recommended a minimum of 4 weeks of IV antibiotic therapy with vancomycin, as well as an additional 2 weeks of therapy with Rocephin. A PICC line has been placed for IV antibiotic administration with the plans to discharge him to a rehabilitation facility for ongoing antibiotic therapy and rehabilitation. His vancomycin trough levels should be monitored closely with a goal trough level of 15-20.  His renal function should be monitored closely, while on Vancomycin. Active Problems:  Hypokalemia  The patient's potassium has been corrected.  Hyponatremia  The patient's sodium has been corrected.    Recommendations for hospital follow-up: 1.  Follow up with Dr. Drue Second in ID clinic in 2 weeks. 2.  Follow up with Dr. Victorino Dike in 2 weeks 3.  Monitor Vancomycin trough levels periodically, goal trough 15-20.  Diet:  Low sodium, heart healthy  Activity:  Increase activity slowly, walk with assistance, with a Dutil  Condition at Discharge:  Improved  Time spent on Discharge:  35 minutes  Signed: Dr. Trula Ore Jules Baty Pager [phone] 06/08/2011, 12:30 PM

## 2011-06-09 ENCOUNTER — Encounter (HOSPITAL_COMMUNITY): Payer: Self-pay | Admitting: Orthopedic Surgery

## 2011-06-09 NOTE — Progress Notes (Signed)
Patient set to discharge to [facility] today. Daughter, Chyrl Civatte to transport patient to facility. Chart copy packet given to patient to bring to SNF.   Unice Bailey, LCSWA [phone]

## 2011-06-10 LAB — ANAEROBIC CULTURE: Gram Stain: NONE SEEN

## 2011-06-12 ENCOUNTER — Telehealth: Payer: Self-pay | Admitting: *Deleted

## 2011-06-12 LAB — CULTURE, BLOOD (ROUTINE X 2)
Culture  Setup Time: 201303260217
Culture  Setup Time: 201303260217
Culture: NO GROWTH

## 2011-06-12 NOTE — Telephone Encounter (Signed)
Spoke with patients daughter who said that moving the patients appointment up would be good as he has been complaining of pain in the knee of his Left leg. She will try to call the facility and asked that I try as she has the same problem getting through to them (she lives in [place]). Advised her will do.

## 2011-06-12 NOTE — Telephone Encounter (Signed)
Called patient at [facility] [phone] the facility where he is living at this time to try and set up an appointment with him. Had to leave a message for Nurse Supervisor to call me back. We need to change his appointment from 07/18/11 to 06/22/11 at 10 am if possible. Will try to call back later today if they do not call us back.

## 2011-06-15 ENCOUNTER — Telehealth: Payer: Self-pay | Admitting: *Deleted

## 2011-06-15 NOTE — Telephone Encounter (Signed)
Called [facility] to follow up about appointment information. Spoke with Pattricia Boss who had gotten the information from patients daughter. We verified the information and gave her the contact number for our office and ended the call.   INFORMATION: 06/22/11 at 10 am with Dr Drue Second

## 2011-06-22 ENCOUNTER — Encounter: Payer: Self-pay | Admitting: Internal Medicine

## 2011-06-22 ENCOUNTER — Ambulatory Visit (INDEPENDENT_AMBULATORY_CARE_PROVIDER_SITE_OTHER): Payer: Medicare Other | Admitting: Internal Medicine

## 2011-06-22 VITALS — BP 151/67 | HR 68 | Temp 97.2°F | Wt 159.0 lb

## 2011-06-22 DIAGNOSIS — H6120 Impacted cerumen, unspecified ear: Secondary | ICD-10-CM

## 2011-06-22 DIAGNOSIS — M869 Osteomyelitis, unspecified: Secondary | ICD-10-CM

## 2011-06-22 LAB — BASIC METABOLIC PANEL
CO2: 27 mEq/L (ref 19–32)
Calcium: 8.3 mg/dL — ABNORMAL LOW (ref 8.4–10.5)
Chloride: 100 mEq/L (ref 96–112)
Sodium: 137 mEq/L (ref 135–145)

## 2011-06-22 LAB — CBC WITH DIFFERENTIAL/PLATELET
Lymphocytes Relative: 15 % (ref 12–46)
Lymphs Abs: 1.3 10*3/uL (ref 0.7–4.0)
Neutrophils Relative %: 69 % (ref 43–77)
Platelets: 418 10*3/uL — ABNORMAL HIGH (ref 150–400)
RBC: 3.5 MIL/uL — ABNORMAL LOW (ref 4.22–5.81)
WBC: 8.7 10*3/uL (ref 4.0–10.5)

## 2011-06-22 NOTE — Progress Notes (Signed)
Lab draw from PICC for CMET, CBC, CRP, Sed Rate from Dr. Drue Second : Pt. identified w/ name and DOB. Donned gloves. Clean tubing/hub connection cap with CHG wipe for 20 seconds, using scrubbing motion. Attached empty, sterile 10 cc syringe, opened clamp, withdrew 10 cc of waste and set aside. Attached next syringe and withrew 18 cc of blood, transferred to lab tube. Verbal order received to redress PICC site.  Sterile procedure to redress PICC site completed.  PICC site without redness or drainage.

## 2011-06-26 NOTE — Progress Notes (Signed)
INFECTIOUS DISEASE CLINIC NOTE  RFV= hospital follow up visit, for IV abtx for osteomyelitis  Subjective:    Patient ID: [name], male    DOB: 09/11/1924, 78 y.o.   MRN: [id number]  HPIJoseph WHEELER Schaefer is a 78 y.o. male presented to [facility] on 3/22 with 10 day history of worsening left lower leg swelling and erythema. He reports having recent podiatry work done with removal of a corn to his left great toe. He noticed 5 days later having increasing tenderness, redness and swelling to his left leg. He subsequently started oral antibiotics, amox/clav plus cipro, on 05/26/11, concerning for infection by his podiatrist. Upon follow-up with his podiatrist on 3/19, it appeared that his foot did still have evidence of cellulitis and was referred to go to the hospital for further evaluation. He reported subjective fevers and chills for 3 days prior to admit with Tmax of 100.4. He denies nightsweats, no nausea/vomiting/backpain    On admit he was found to have WBC on upper limits of normal at 9.7, no differential taken at that time .He had MRI of Left foot that suggested he had small abscess as well as bone marrow signal suggestive of osteomyelitis of left hallux. He did have a T max of 100.35F on 06/03/11, but no blood cultures were taken at that time.   He was started initially on vancomycin and pip/tazo and underwent an I X D on 3/24 where the abscess was debrided, toe nail removed, and removal of distal phalynx. Tissue cultures from that surgery revealed MRSA ( oxacillin R, vanco MIC 1). He subsequently underwent amputation of left hallux for optimal treatment of osteomyelitis on 06/05/11. Cultures from amputated toe were positive for klebseilla pnemonaie. Ceftriaxone was then added in addition to vancomycin for mop-up therapy for osteomyelitis. Suprisingly, the patient is not known to have history of peripheral vascular disease nor diabetes. Since there were no blood cultures at time of admit to determine if  patient was bacteremic, final id recs included 4 wks of vancomycin and 2 wks of ceftriaxone for mop up of klebsiella.  Since being at rehab, he states he has been participating with physical therapy, his wound still occasionally bleeds but no erythema like it was in the past. He has followed up with surgery who feel that he is making progress with his wound health. His dressings get changed twice a day. He denies any tenderness at his picc line. No fever/chills/nightsweats/nor diarrhea. He did have constipation which he feels is related to his pain medications which he is cutting back. He does subscribe to 2 days of malaise but now slowly improving. He has somewhat decreased hearing in his right ear but denies tinnitus, no symptoms of room spinning or unsteady gait.  Prior to Admission medications   Medication Sig Start Date End Date Taking? Authorizing Provider  alendronate (FOSAMAX) 70 MG tablet Take 70 mg by mouth every 7 (seven) days. Take with a full glass of water on an empty stomach.   Yes Historical Provider, MD  Calcium Carbonate (CALCIUM 600 PO) Take 1 tablet by mouth daily.   Yes Historical Provider, MD  dextrose 5 % SOLN 50 mL with cefTRIAXone 2 G SOLR 2 g Inject 2 g into the vein daily. X 2 weeks 06/08/11  Yes Maryruth Bun Rama, MD  Multiple Vitamins-Minerals (MULTIVITAMINS THER. W/MINERALS) TABS Take 1 tablet by mouth daily.   Yes Historical Provider, MD  multivitamin (PROSIGHT) TABS Take 1 tablet by mouth daily.   Yes  Historical Provider, MD  vancomycin (VANCOCIN) 1 GM/200ML SOLN Inject 200 mLs (1,000 mg total) into the vein every 12 (twelve) hours. X 4 weeks. 06/08/11 07/03/11 Yes Maryruth Bun Rama, MD    Past Medical History   Diagnosis  Date   .  Pneumonia    - s/p tonsillectomy  - s/p hernia repair  - s/p repair of tendon for anterior dislocation of Left shoulder  - osteoporosis  - arthritis  - last echocardiogram in 2010   PCP = perini at [facility]   Allergies: No  Known Allergies   Social history = previously lived on his own. At rehab. Accompanied by his niece today. Will give update to daughter, Randa Evens well [phone].  Family history= reviewed and non-contributory  Review of Systems  Constitutional: Negative for fever, chills, diaphoresis, activity change, appetite change. Had 2 day of fatigue but now his appetite is improving.  HENT: Negative for congestion, sore throat, rhinorrhea, sneezing, trouble swallowing and sinus pressure.  Eyes: Negative for photophobia and visual disturbance.  Respiratory: Negative for cough, chest tightness, shortness of breath, wheezing and stridor.  Cardiovascular: Negative for chest pain, palpitations and leg swelling.  Gastrointestinal: Negative for nausea, vomiting, abdominal pain, diarrhea, constipation, blood in stool, abdominal distention and anal bleeding.  Genitourinary: Negative for dysuria, hematuria, flank pain and difficulty urinating.  Musculoskeletal: Negative for myalgias, back pain, joint swelling, arthralgias and gait problem.  Skin: Negative for color change, pallor, rash and wound.  Neurological: Negative for dizziness, tremors, weakness and light-headedness.  Hematological: Negative for adenopathy. Does not bruise/bleed easily.  Psychiatric/Behavioral: Negative for behavioral problems, confusion, sleep disturbance, dysphoric mood, decreased concentration and agitation.       Objective:   Physical Exam BP 151/67  Pulse 68  Temp(Src) 97.2 F (36.2 C) (Oral)  Wt 159 lb (72.122 kg)  General Appearance:    Alert, cooperative, no distress, appears stated age  Head:    Normocephalic, without obvious abnormality, atraumatic  Eyes:    PERRL, conjunctiva/corneas clear, EOM's intact,   Ears:    Occluded right TM that obscures ear canal  Nose:   Nares normal, septum midline, mucosa normal, no drainage   or sinus tenderness  Throat:   Lips, mucosa, and tongue normal; teeth and gums normal  Neck:    Supple, symmetrical, trachea midline, no adenopathy;        Back:     Symmetric, no curvature, ROM normal, no CVA tenderness  Lungs:     Clear to auscultation bilaterally, respirations unlabored     Heart:    Regular rate and rhythm, S1 and S2 normal, no murmur, rub   or gallop  Abdomen:     Soft, non-tender, bowel sounds active all four quadrants,    no masses, no organomegaly        Extremities:   Left great toe is amputated. There is some new red blood on bandage, wound bed still has sutures, healing slowly no fibrinous exudate, no fluctuance or drainage. Still midly erythamatous.non tender. No warmth.  Pulses:   2+ and symmetric all extremities  Skin:   PICC line c/d/i.   Lymph nodes:   Cervical, supraclavicular, and axillary nodes normal  Neurologic:   CNII-XII intact. Normal strength, sensation and reflexes      throughout  Today's labs: Lab Results  Component Value Date   CRP 1.70* 06/22/2011   Lab Results  Component Value Date   ESRSEDRATE 58* 06/22/2011    Historic Labs: 3/26 esr 70  3/26 crp 3.98  Historic MICRO:  06/03/11 tissue cx: MRSA (oxacillin R, vanco 1, clinda <0.25 , erythro > 8 , bactrim < 10 , doxy < 1, and rif <0.5 )  06/05/11 tissue cx: klebseilla pneumonaie(   Historic IMAGING MRI FOOT 06/02/11:  Findings: A large ulceration is identified in the dorsal soft  tissues centered over the proximal phalanx of the great toe. A  fluid collection is seen over the dorsal aspect of the distal  phalanx measuring 1.3 cm transverse by 0.9 cm cranial-caudal by 1.7  cm long. Diffuse and marked subcutaneous edema is present about  the foot.  There is bone marrow edema throughout the distal phalanx of the  great toe. Also seen is marrow edema in the proximal phalanx of  the great toe. Edema is most intense in the distal 0.7 cm of the  proximal phalanx with mild marrow edema also seen throughout the  diaphysis. Bone marrow signal is otherwise normal.  The patient has  small joint effusions at all of the MTP joints. No  mass is identified. No muscle or tendon tear.  IMPRESSION:  1. Large skin ulceration over the dorsum of the great toe. Fluid  collection over the dorsum of the distal phalanx is consistent with  an abscess.  2. Signal abnormality throughout the distal phalanx of the great  toe is consistent with osteomyelitis. Marrow signal abnormality is  also seen in the proximal phalanx of the great toe worst in the  head compatible with osteomyelitis.  3. Diffuse subcutaneous edema over the dorsum of the foot could be  due to cellulitis and/or dependent change.     Assessment & Plan:    osteomyelitis due to MRSA and klebseilla s/p amputation of toe on 3/25, currently therapeutic on vancomycin and ceftriaxone.  -- continue with an additional 2 wks of IV vancomycin; still do weekly BMP and vanco trough -- will recheck ESR and CRP at this visit to ensure inflam markers trending down.  -- finish mop-up with ceftriaxone today.  Occluded ear canal = will wash out in clinic, and extract cerumen  rtc in 2 wks to evaluate wound and see if need oral therapy.

## 2011-06-30 ENCOUNTER — Telehealth: Payer: Self-pay | Admitting: Licensed Clinical Social Worker

## 2011-06-30 NOTE — Telephone Encounter (Signed)
Patient's daughter called stating that the patient had to have a denture alignment and wanted to know if that was ok since he had MRSA on his foot and was under Dr. Feliz Beam care. I told her it should be fine, because it was not invasive. She states it's just impressions of his mouth.

## 2011-07-04 ENCOUNTER — Telehealth: Payer: Self-pay | Admitting: *Deleted

## 2011-07-04 NOTE — Telephone Encounter (Signed)
dtr called concerned that he is getting out of a facility 07/14/11. His appt for 07/13/11 was changed to 07/26/11. She had heard that md wanted to see him sooner. His meds end this Thursday. She asked that we call her about this. [phone]   I asked Dr. Drue Second about this & she said she will review this this afternoon

## 2011-07-07 ENCOUNTER — Telehealth: Payer: Self-pay | Admitting: Licensed Clinical Social Worker

## 2011-07-07 ENCOUNTER — Telehealth: Payer: Self-pay | Admitting: *Deleted

## 2011-07-07 NOTE — Telephone Encounter (Signed)
Patient's daughter called stating that there was "confusion" with his appointment. It had been rescheduled from 5/2 to 5/14 with Dr. Drue Second. The patient is in an assisted living facility just for IV therapy and will need to be discharge when antibiotics end which is supposed to be on 5/3. I rescheduled him with Dr. Luciana Axe on 07/13/2011 so he will not incur any additional charges for the facility.

## 2011-07-07 NOTE — Telephone Encounter (Signed)
Patient daughter called and she was upset that her fathers appt had been rescheduled and wants to know if we could just give the order for the PICC removal to the facility where he is living. Advised her that the provider needs to see the wound and lab work before we can give that order and sometimes the PICC may need to stay in place longer than originally scheduled. After much discussion was able to calm her down and help her understand that this visit is needed and that we could pull the PICC in clinic if the provider feels it is necessary. She said she will try to find someone to bring him in and if not she will call us back.

## 2011-07-13 ENCOUNTER — Ambulatory Visit: Payer: Medicare Other | Admitting: Internal Medicine

## 2011-07-13 ENCOUNTER — Encounter: Payer: Self-pay | Admitting: Internal Medicine

## 2011-07-13 ENCOUNTER — Ambulatory Visit (INDEPENDENT_AMBULATORY_CARE_PROVIDER_SITE_OTHER): Payer: Medicare Other | Admitting: Internal Medicine

## 2011-07-13 VITALS — BP 154/75 | HR 103 | Temp 97.5°F | Wt 159.0 lb

## 2011-07-13 DIAGNOSIS — M869 Osteomyelitis, unspecified: Secondary | ICD-10-CM

## 2011-07-13 LAB — CBC WITH DIFFERENTIAL/PLATELET
Basophils Relative: 1 % (ref 0–1)
Hemoglobin: 9.5 g/dL — ABNORMAL LOW (ref 13.0–17.0)
MCHC: 32.9 g/dL (ref 30.0–36.0)
Monocytes Relative: 8 % (ref 3–12)
Neutro Abs: 5.5 10*3/uL (ref 1.7–7.7)
Neutrophils Relative %: 67 % (ref 43–77)
Platelets: 404 10*3/uL — ABNORMAL HIGH (ref 150–400)
RBC: 3.16 MIL/uL — ABNORMAL LOW (ref 4.22–5.81)

## 2011-07-13 LAB — COMPREHENSIVE METABOLIC PANEL
ALT: 14 U/L (ref 0–53)
AST: 17 U/L (ref 0–37)
Albumin: 3.2 g/dL — ABNORMAL LOW (ref 3.5–5.2)
Alkaline Phosphatase: 83 U/L (ref 39–117)
Glucose, Bld: 79 mg/dL (ref 70–99)
Potassium: 4 mEq/L (ref 3.5–5.3)
Sodium: 137 mEq/L (ref 135–145)
Total Protein: 6.6 g/dL (ref 6.0–8.3)

## 2011-07-13 LAB — C-REACTIVE PROTEIN: CRP: 4.69 mg/dL — ABNORMAL HIGH (ref <0.60)

## 2011-07-13 MED ORDER — SULFAMETHOXAZOLE-TMP DS 800-160 MG PO TABS: 2.0000 | ORAL_TABLET | Freq: Two times a day (BID) | ORAL | Status: DC

## 2011-07-13 NOTE — Progress Notes (Signed)
  Subjective:    Patient ID: [name], male    DOB: 31-Mar-1924, 78 y.o.   MRN: [id number]  HPI 78 y.o. male presented to [facility] on 3/22 with 10 day history of worsening left lower leg swelling and erythema. He reports having recent podiatry work done with removal of a corn to his left great toe. He noticed 5 days later having increasing tenderness, redness and swelling to his left leg. He subsequently started oral antibiotics, amox/clav plus cipro, on 05/26/11, concerning for infection by his podiatrist. Upon follow-up with his podiatrist on 3/19, it appeared that his foot did still have evidence of cellulitis and was referred to go to the hospital for further evaluation. He reported subjective fevers and chills for 3 days prior to admit with Tmax of 100.4. He denies nightsweats, no nausea/vomiting/backpain  On admit he was found to have WBC on upper limits of normal at 9.7, no differential taken at that time .He had MRI of Left foot that suggested he had small abscess as well as bone marrow signal suggestive of osteomyelitis of left hallux. He did have a T max of 100.54F on 06/03/11, but no blood cultures were taken at that time.  He was started initially on vancomycin and pip/tazo and underwent an I X D on 3/24 where the abscess was debrided, toe nail removed, and removal of distal phalynx. Tissue cultures from that surgery revealed MRSA ( oxacillin R, vanco MIC 1). He subsequently underwent amputation of left hallux for optimal treatment of osteomyelitis on 06/05/11. Cultures from amputated toe were positive for klebseilla pnemonaie. Ceftriaxone was then added in addition to vancomycin for mop-up therapy for osteomyelitis. Suprisingly, the patient is not known to have history of peripheral vascular disease nor diabetes. Since there were no blood cultures at time of admit to determine if patient was bacteremic, final id recs included 4 wks of vancomycin and 2 wks of ceftriaxone for mop up of klebsiella.  He  was last seen about 3 weeks ago and was doing well and his antibiotics were going to be continued until today's visit. He continues on vancomycin and has been off of ceftriaxone. She has had no fever or chills. He is tolerated medicine well.    Review of Systems  Constitutional: Negative for fever, chills and fatigue.  Respiratory: Negative.   Cardiovascular: Negative for chest pain, palpitations and leg swelling.  Gastrointestinal: Negative for nausea, abdominal pain and diarrhea.  Musculoskeletal: Negative.   Skin: Negative for rash.  Neurological: Negative.   Psychiatric/Behavioral: Negative for dysphoric mood. The patient is not nervous/anxious.        Objective:   Physical Exam  Vitals reviewed. Musculoskeletal:       Toe area of amputation is clean with no discharge, there is some surrounding erythema on the anterior aspect of his foot, though there is no warmth.  Skin:       Neck is clean dry without any discharge          Assessment & Plan:

## 2011-07-13 NOTE — Assessment & Plan Note (Addendum)
He has completed IV therapy and actually has had definitive therapy with the amputation. The PICC line today was pulled and the vancomycin will be stopped. He does have some surrounding erythema so will put him on Bactrim for 2 weeks to assure clearance. I also will recheck inflammatory markers today

## 2011-07-13 NOTE — Progress Notes (Signed)
Per Dr. Luciana Axe PICC removed from patient's right arm. Order for labs placed and drawn form PICC prior to removal. First 10 cc of blood destroyed.   No sutures in place. 43 cm PICC removed without any problems. Site unremarkable.  Petroleum gauze applied to PICC site. Patient advised no heavy lifting with this arm. Dressing to remain for 24 hours prior to removal. If blood starts to stain the dressing call our office for eval.    Pt tolerated the procedure the well.   Laurell Josephs, RN IV, BSN

## 2011-07-14 LAB — SEDIMENTATION RATE: Sed Rate: 60 mm/hr — ABNORMAL HIGH (ref 0–16)

## 2011-07-18 ENCOUNTER — Inpatient Hospital Stay: Payer: Medicare Other | Admitting: Internal Medicine

## 2011-07-25 ENCOUNTER — Encounter (HOSPITAL_BASED_OUTPATIENT_CLINIC_OR_DEPARTMENT_OTHER): Payer: Medicare Other | Attending: General Surgery

## 2011-07-25 ENCOUNTER — Ambulatory Visit: Payer: Medicare Other | Admitting: Internal Medicine

## 2011-07-25 DIAGNOSIS — S98119A Complete traumatic amputation of unspecified great toe, initial encounter: Secondary | ICD-10-CM | POA: Insufficient documentation

## 2011-07-25 DIAGNOSIS — L97509 Non-pressure chronic ulcer of other part of unspecified foot with unspecified severity: Secondary | ICD-10-CM | POA: Insufficient documentation

## 2011-07-25 NOTE — H&P (Signed)
NAMESAVOY, [place]IL[place]               ACCOUNT NO.:  [id number]  MEDICAL RECORD NO.:  [id number]  LOCATION:  FOOT                         FACILITY:  [facility]  PHYSICIAN:  Joanne Gavel, M.D.        DATE OF BIRTH:  [birth year]  DATE OF ADMISSION:  07/25/2011 DATE OF DISCHARGE:                             HISTORY & PHYSICAL   CHIEF COMPLAINT:  Wound left foot.  HISTORY OF PRESENT ILLNESS:  This 78 year old male developed osteomyelitis of the first toe.  This was treated on June 06, 2011, by toe and ray amputation.  This wound has been very slow to heal.  PAST MEDICAL HISTORY:  Remarkably negative.  PAST SURGICAL HISTORY:  Reveals a repair of the shoulder injury, tonsillectomy, and a great toe amputation.  ALLERGIES:  None.  MEDICATIONS:  Calcium, aspirin, and Bactrim DS.  SOCIAL HISTORY:  Cigarettes 0 x6 months.  He did smoke in the past. Alcohol moderate but 0 x6 months.  REVIEW OF SYSTEMS:  No history of epileptic convulsion tremor, no heart disease, lung disease, or kidney disease.  PHYSICAL EXAMINATION:  VITAL SIGNS: Temperature 97.6, pulse 74, respirations 18, blood pressure 127/68.  GENERAL APPEARANCE:  Awake and alert.  Well developed, slender male. EYES, EARS, NOSE, THROAT: Normal. CHEST: Clear. HEART: Regular rhythm. EXTREMITIES: Examination of the left lower extremity reveals some signs of venous stasis.  There is a pounding dorsalis pedis pulse.  There is a 5.3 x 2.4 wound at the site of the amputation which is covered with a thick eschar.  The eschar was excised along with some necrotic subcutaneous tissue.  The base of the wound remained shaggy.  IMPRESSION:  Status post amputation of the first metatarsal head and toe.  PLAN OF TREATMENT:  Start with Santyl.  We may need skin substitutes or possibly VAC.  I will see him in 7 days.     Joanne Gavel, M.D.     RA/MEDQ  D:  07/25/2011  T:  07/25/2011  Job:  [id number]  cc:   Toni Arthurs, MD

## 2011-08-08 ENCOUNTER — Ambulatory Visit (INDEPENDENT_AMBULATORY_CARE_PROVIDER_SITE_OTHER): Payer: Medicare Other | Admitting: Internal Medicine

## 2011-08-08 ENCOUNTER — Encounter: Payer: Self-pay | Admitting: Internal Medicine

## 2011-08-08 VITALS — BP 127/68 | HR 99 | Temp 97.6°F | Wt 162.0 lb

## 2011-08-08 DIAGNOSIS — M869 Osteomyelitis, unspecified: Secondary | ICD-10-CM

## 2011-08-08 LAB — BASIC METABOLIC PANEL
BUN: 24 mg/dL — ABNORMAL HIGH (ref 6–23)
CO2: 24 mEq/L (ref 19–32)
Calcium: 9.4 mg/dL (ref 8.4–10.5)
Chloride: 101 mEq/L (ref 96–112)
Creat: 1.41 mg/dL — ABNORMAL HIGH (ref 0.50–1.35)
Glucose, Bld: 93 mg/dL (ref 70–99)

## 2011-08-08 MED ORDER — SULFAMETHOXAZOLE-TMP DS 800-160 MG PO TABS: 2.0000 | ORAL_TABLET | Freq: Two times a day (BID) | ORAL | Status: DC

## 2011-08-09 LAB — SEDIMENTATION RATE: Sed Rate: 49 mm/hr — ABNORMAL HIGH (ref 0–16)

## 2011-08-15 ENCOUNTER — Encounter (HOSPITAL_BASED_OUTPATIENT_CLINIC_OR_DEPARTMENT_OTHER): Payer: Medicare Other | Attending: General Surgery

## 2011-08-15 DIAGNOSIS — X58XXXA Exposure to other specified factors, initial encounter: Secondary | ICD-10-CM | POA: Insufficient documentation

## 2011-08-15 DIAGNOSIS — L97509 Non-pressure chronic ulcer of other part of unspecified foot with unspecified severity: Secondary | ICD-10-CM | POA: Insufficient documentation

## 2011-08-15 DIAGNOSIS — S98119A Complete traumatic amputation of unspecified great toe, initial encounter: Secondary | ICD-10-CM | POA: Insufficient documentation

## 2011-08-15 DIAGNOSIS — S41109A Unspecified open wound of unspecified upper arm, initial encounter: Secondary | ICD-10-CM | POA: Insufficient documentation

## 2011-08-15 DIAGNOSIS — Z79899 Other long term (current) drug therapy: Secondary | ICD-10-CM | POA: Insufficient documentation

## 2011-08-15 DIAGNOSIS — S81009A Unspecified open wound, unspecified knee, initial encounter: Secondary | ICD-10-CM | POA: Insufficient documentation

## 2011-08-15 DIAGNOSIS — E1169 Type 2 diabetes mellitus with other specified complication: Secondary | ICD-10-CM | POA: Insufficient documentation

## 2011-08-22 ENCOUNTER — Encounter (HOSPITAL_BASED_OUTPATIENT_CLINIC_OR_DEPARTMENT_OTHER): Payer: Medicare Other

## 2011-08-24 ENCOUNTER — Ambulatory Visit (INDEPENDENT_AMBULATORY_CARE_PROVIDER_SITE_OTHER): Payer: Medicare Other | Admitting: Internal Medicine

## 2011-08-24 ENCOUNTER — Encounter: Payer: Self-pay | Admitting: Internal Medicine

## 2011-08-24 VITALS — BP 132/77 | HR 88 | Temp 98.1°F | Ht 71.0 in | Wt 165.2 lb

## 2011-08-24 DIAGNOSIS — M869 Osteomyelitis, unspecified: Secondary | ICD-10-CM

## 2011-08-24 LAB — BASIC METABOLIC PANEL WITH GFR
BUN: 20 mg/dL (ref 6–23)
Calcium: 9.2 mg/dL (ref 8.4–10.5)
Creat: 1.33 mg/dL (ref 0.50–1.35)
GFR, Est African American: 56 mL/min — ABNORMAL LOW
GFR, Est Non African American: 48 mL/min — ABNORMAL LOW

## 2011-08-24 LAB — C-REACTIVE PROTEIN: CRP: 1.32 mg/dL — ABNORMAL HIGH (ref <0.60)

## 2011-08-24 MED ORDER — SULFAMETHOXAZOLE-TMP DS 800-160 MG PO TABS: 2.0000 | ORAL_TABLET | Freq: Two times a day (BID) | ORAL | Status: DC

## 2011-09-12 ENCOUNTER — Encounter (HOSPITAL_BASED_OUTPATIENT_CLINIC_OR_DEPARTMENT_OTHER): Payer: Medicare Other | Attending: General Surgery

## 2011-09-12 DIAGNOSIS — L97509 Non-pressure chronic ulcer of other part of unspecified foot with unspecified severity: Secondary | ICD-10-CM | POA: Insufficient documentation

## 2011-09-12 DIAGNOSIS — S98119A Complete traumatic amputation of unspecified great toe, initial encounter: Secondary | ICD-10-CM | POA: Insufficient documentation

## 2011-09-12 DIAGNOSIS — E1169 Type 2 diabetes mellitus with other specified complication: Secondary | ICD-10-CM | POA: Insufficient documentation

## 2011-09-21 ENCOUNTER — Encounter: Payer: Self-pay | Admitting: Internal Medicine

## 2011-09-21 ENCOUNTER — Ambulatory Visit (INDEPENDENT_AMBULATORY_CARE_PROVIDER_SITE_OTHER): Payer: Medicare Other | Admitting: Internal Medicine

## 2011-09-21 VITALS — BP 133/62 | HR 74 | Temp 97.3°F | Wt 166.0 lb

## 2011-09-21 DIAGNOSIS — M869 Osteomyelitis, unspecified: Secondary | ICD-10-CM

## 2011-09-21 LAB — CBC WITH DIFFERENTIAL/PLATELET
Basophils Absolute: 0.1 10*3/uL (ref 0.0–0.1)
Basophils Relative: 1 % (ref 0–1)
HCT: 29.5 % — ABNORMAL LOW (ref 39.0–52.0)
Hemoglobin: 9.7 g/dL — ABNORMAL LOW (ref 13.0–17.0)
MCHC: 32.9 g/dL (ref 30.0–36.0)
Monocytes Absolute: 0.9 10*3/uL (ref 0.1–1.0)
Neutro Abs: 4.1 10*3/uL (ref 1.7–7.7)
Platelets: 284 10*3/uL (ref 150–400)
RBC: 3.18 MIL/uL — ABNORMAL LOW (ref 4.22–5.81)
RDW: 16.2 % — ABNORMAL HIGH (ref 11.5–15.5)
WBC: 6.6 10*3/uL (ref 4.0–10.5)

## 2011-09-21 LAB — BASIC METABOLIC PANEL WITH GFR
BUN: 20 mg/dL (ref 6–23)
Creat: 1.2 mg/dL (ref 0.50–1.35)
GFR, Est African American: 63 mL/min
GFR, Est Non African American: 55 mL/min — ABNORMAL LOW
Potassium: 5 mEq/L (ref 3.5–5.3)

## 2011-09-22 NOTE — Progress Notes (Signed)
ID CLINIC FOLLOW UP  RFV: routine visit for osteomyelitis of left hallux s/p amputation in March 2013  Subjective:    Patient ID: [name], male    DOB: 06/25/1924, 78 y.o.   MRN: [id number]  HPI [name] is a 78 y.o. male with osteomyelitis of left hallux s/p amputation in March 2013, cultured isolated MRSA ( oxacillin R, vanco MIC 1). and klebseilla pnemonaie. He has been on chronic suppression with bactrim DS 2 tabs BID. We cultured his wound at his last visit since it still have significant purulent exudate in the wound bed, fearing that he was not on appropriate antibiotics, although his inflammatory markers were somewhat trending down. The superficial swab did isolate a few amp S enterococcus.   I did not change his therapy since he mentioned that his wound had shown signs of good healing. He is soon to be released from wound clinic. No fever/chills/nightsweats/nor diarrhea. His wound is less deep and smaller in size.   He also mentioned that he recently fell on his left arm after a dog jumped on him. He sustained abrasions to elbow and forearm. He was seen in ED where they felt he can use topical ointment. He denies warmth, pain or purulent drainage from his arm.  Active Ambulatory Problems    Diagnosis Date Noted  . Osteomyelitis of toe of left foot 06/07/2011  . Cellulitis 06/07/2011  . Hypokalemia 06/07/2011  . Hyponatremia 06/07/2011  . MRSA (methicillin resistant staph aureus) culture positive 06/07/2011   Resolved Ambulatory Problems    Diagnosis Date Noted  . No Resolved Ambulatory Problems   Past Medical History  Diagnosis Date  . Pneumonia    Current Outpatient Prescriptions on File Prior to Visit  Medication Sig Dispense Refill  . alendronate (FOSAMAX) 70 MG tablet Take 70 mg by mouth every 7 (seven) days. Take with a full glass of water on an empty stomach.      . Calcium Carbonate (CALCIUM 600 PO) Take 1 tablet by mouth daily.      . Multiple  Vitamins-Minerals (MULTIVITAMINS THER. W/MINERALS) TABS Take 1 tablet by mouth daily.      . multivitamin (PROSIGHT) TABS Take 1 tablet by mouth daily.      Marland Kitchen sulfamethoxazole-trimethoprim (BACTRIM DS) 800-160 MG per tablet Take 2 tablets by mouth 2 (two) times daily.  30 tablet  11    Social hx: has 8 grandkids. 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 History  Substance Use Topics  . Smoking status: Former Smoker    Types: Cigars    Quit date: 06/02/2011  . Smokeless tobacco: Never Used  . Alcohol Use: No  family history is not on file.   Review of Systems Review of Systems  Constitutional: Negative for fever, chills, diaphoresis, activity change, appetite change, fatigue and unexpected weight change.  HENT: Negative for congestion, sore throat, rhinorrhea, sneezing, trouble swallowing and sinus pressure.  Eyes: Negative for photophobia and visual disturbance.  Respiratory: Negative for cough, chest tightness, shortness of breath, wheezing and stridor.  Cardiovascular: Negative for chest pain, palpitations and leg swelling.  Gastrointestinal: Negative for nausea, vomiting, abdominal pain, diarrhea, constipation, blood in stool, abdominal distention and anal bleeding.  Genitourinary: Negative for dysuria, hematuria, flank pain and difficulty urinating.  Musculoskeletal: Negative for myalgias, back pain, joint swelling, arthralgias and gait problem.  Skin: per hpi Neurological: Negative for dizziness, tremors, weakness and light-headedness.  Hematological: Negative for adenopathy. Does not bruise/bleed easily.  Psychiatric/Behavioral: Negative  for behavioral problems, confusion, sleep disturbance, dysphoric mood, decreased concentration and agitation.       Objective:   Physical Exam  BP 133/62  Pulse 74  Temp 97.3 F (36.3 C)  Wt 166 lb (75.297 kg) Physical Exam  Constitutional: He is oriented to person, place, and time. He appears well-developed and well-nourished. No  distress.  HENT: [place]/AT, PERRLA, EOMI Mouth/Throat: Oropharynx is clear and moist. No oropharyngeal exudate.  Cardiovascular: Normal rate, regular rhythm and normal heart sounds. Exam reveals no gallop and no friction rub. No murmur heard.  Pulmonary/Chest: Effort normal and breath sounds normal. No respiratory distress. He has no wheezes.  Abdominal: Soft. Bowel sounds are normal. He exhibits no distension. There is no tenderness.  Lymphadenopathy:  He has no cervical adenopathy.  Neurological: He is alert and oriented to person, place, and time.  Skin: right foot still has lateral ulcer from poor wound healing from his amputation but somewhat improved from last month's visit; exudate dried and crusted around abrasions of left arm Psychiatric: He has a normal mood and affect. His behavior is normal.   Labs: CBC    Component Value Date/Time   WBC 6.6 09/21/2011 1209   RBC 3.18* 09/21/2011 1209   HGB 9.7* 09/21/2011 1209   HCT 29.5* 09/21/2011 1209   PLT 284 09/21/2011 1209   MCV 92.8 09/21/2011 1209   MCH 30.5 09/21/2011 1209   MCHC 32.9 09/21/2011 1209   RDW 16.2* 09/21/2011 1209   LYMPHSABS 1.1 09/21/2011 1209   MONOABS 0.9 09/21/2011 1209   EOSABS 0.5 09/21/2011 1209   BASOSABS 0.1 09/21/2011 1209    BMET    Component Value Date/Time   NA 133* 09/21/2011 1209   K 5.0 09/21/2011 1209   CL 101 09/21/2011 1209   CO2 24 09/21/2011 1209   GLUCOSE 64* 09/21/2011 1209   BUN 20 09/21/2011 1209   CREATININE 1.20 09/21/2011 1209   CREATININE 0.81 06/08/2011 1045   CALCIUM 9.2 09/21/2011 1209   GFRNONAA 79* 06/08/2011 1045   GFRAA >90 06/08/2011 1045   Lab Results  Component Value Date   ESRSEDRATE 18* 09/21/2011   Lab Results  Component Value Date   CRP <0.5 09/21/2011        Assessment & Plan:  Osteomyelitis = the patient's inflammatory markers are back to baseline. We will finish up his course of bactrim for 14 more days. Will do 1 tab bactrim twice a day.   Abrasion for left arm = will  wash and dress at this appointment  rtc in 2-3 wks to unsure his wound is improving.

## 2011-10-10 NOTE — Progress Notes (Signed)
[facility]  NAME:  [name], [name]               ACCOUNT NO.:  [id number]  MEDICAL RECORD NO.:  [id number]      DATE OF BIRTH:  1924/03/17  PHYSICIAN:  Joanne Gavel, M.D.         VISIT DATE:  10/10/2011                                  OFFICE VISIT   ADMISSION DIAGNOSIS:  Status post 1st ray amputation on June 06, 2011.  DISCHARGE DIAGNOSIS:  Status post 1st ray amputation on June 06, 2011.  CLINICAL COURSE:  The patient was initially treated with Santyl and Hydrogel.  As the wound cleaned up, this was switched to silver collagen.  Finally, the patient had several Oasis applications and on the date of discharge, the wound was completely healed.  DISCHARGE INSTRUCTIONS:  He is to see his primary care doctor about leg edema.  He is to wear support and elevate when necessary and see [place] p.r.n.     Joanne Gavel, M.D.     RA/MEDQ  D:  10/10/2011  T:  10/10/2011  Job:  [id number]

## 2011-10-11 ENCOUNTER — Other Ambulatory Visit: Payer: Self-pay | Admitting: Dermatology

## 2011-10-12 ENCOUNTER — Encounter: Payer: Self-pay | Admitting: Internal Medicine

## 2011-10-12 ENCOUNTER — Ambulatory Visit (INDEPENDENT_AMBULATORY_CARE_PROVIDER_SITE_OTHER): Payer: Medicare Other | Admitting: Internal Medicine

## 2011-10-12 ENCOUNTER — Ambulatory Visit: Payer: Medicare Other | Admitting: Internal Medicine

## 2011-10-12 VITALS — BP 135/75 | HR 78 | Temp 98.3°F | Ht 71.0 in | Wt 169.0 lb

## 2011-10-12 DIAGNOSIS — M869 Osteomyelitis, unspecified: Secondary | ICD-10-CM

## 2011-10-12 NOTE — Progress Notes (Signed)
INFECTIOUS DISEASES CLINIC  RFV: follow up on osteomyelitis  Subjective:    Patient ID: [name], male    DOB: 07-08-1924, 78 y.o.   MRN: [id number]  HPI  [name] is a 78 y.o. male with osteomyelitis of left hallux s/p amputation in March 2013, cultured isolated MRSA ( oxacillin R, vanco MIC 1). and klebseilla pnemonaie. He has been on chronic suppression with bactrim DS 2 tabs BID. We decreased his dose to bactrim DS 1 tab BID due to some renal insufficiency noted on our last visit. Since then he has been released from the wound center since his wound has completely healed. No fever/chills/nightsweats/nor diarrhea.  On our last visit, he had sustained some superficial abrasion from a fall onto his left arm after a dog jumped on him which have healed completely.  He reports still having some swelling to his left leg. No rash, no spontaneous drainage.  Current Outpatient Prescriptions on File Prior to Visit  Medication Sig Dispense Refill  . alendronate (FOSAMAX) 70 MG tablet Take 70 mg by mouth every 7 (seven) days. Take with a full glass of water on an empty stomach.      . Calcium Carbonate (CALCIUM 600 PO) Take 1 tablet by mouth daily.      . Multiple Vitamins-Minerals (MULTIVITAMINS THER. W/MINERALS) TABS Take 1 tablet by mouth daily.      . multivitamin (PROSIGHT) TABS Take 1 tablet by mouth daily.      Marland Kitchen sulfamethoxazole-trimethoprim (BACTRIM DS) 800-160 MG per tablet Take 2 tablets by mouth 2 (two) times daily.  30 tablet  11   Active Ambulatory Problems    Diagnosis Date Noted  . Osteomyelitis of toe of left foot 06/07/2011  . Cellulitis 06/07/2011  . Hypokalemia 06/07/2011  . Hyponatremia 06/07/2011  . MRSA (methicillin resistant staph aureus) culture positive 06/07/2011   Resolved Ambulatory Problems    Diagnosis Date Noted  . No Resolved Ambulatory Problems   Past Medical History  Diagnosis Date  . Pneumonia       Review of Systems   10 point ROS  reviewed, positive pertinents listed in HPI Objective:   Physical Exam BP 135/75  Pulse 78  Temp 98.3 F (36.8 C) (Oral)  Ht 5\' 11"  (1.803 m)  Wt 169 lb (76.658 kg)  BMI 23.57 kg/m2 gen = a xo by 3 in NAD, pleasant as usual and in great spirits HEENT= a few scattered 1.5cm circular erythamatous lesions from recent liquid nitrogen treatment from derm Ext = completely healed. lesion in area of 1st metatarsal of left foot. Warm, dry. Heal of foot has hyperkeratotic areas. +1 edema Labs: Lab Results  Component Value Date   CRP <0.5 09/21/2011   Lab Results  Component Value Date   ESRSEDRATE 18* 09/21/2011      Assessment & Plan:  Osteomyelitis s/p amputation = will stop antibiotics. He has been on therapy since march. Inflammatory markers have normalized. Will release him from his care.  Lower extremitiy swelling= recommend compression stocking.  Hyperkeratosis = unclear if it is tinea vs dry skin of foot but also has area noted on left arm. i have asked patient to ask his dermatologist when he goes next week if they advise him to do anything to it.  Duke Salvia Drue Second MD MPH [facility] [phone]

## 2011-10-17 ENCOUNTER — Encounter (HOSPITAL_BASED_OUTPATIENT_CLINIC_OR_DEPARTMENT_OTHER): Payer: Medicare Other | Attending: General Surgery

## 2011-10-17 DIAGNOSIS — S98119A Complete traumatic amputation of unspecified great toe, initial encounter: Secondary | ICD-10-CM | POA: Insufficient documentation

## 2011-10-17 DIAGNOSIS — E1169 Type 2 diabetes mellitus with other specified complication: Secondary | ICD-10-CM | POA: Insufficient documentation

## 2011-10-17 DIAGNOSIS — L97509 Non-pressure chronic ulcer of other part of unspecified foot with unspecified severity: Secondary | ICD-10-CM | POA: Insufficient documentation

## 2011-10-24 ENCOUNTER — Ambulatory Visit: Payer: Medicare Other | Admitting: Internal Medicine

## 2011-11-15 ENCOUNTER — Other Ambulatory Visit: Payer: Self-pay

## 2011-12-11 NOTE — Progress Notes (Signed)
Subjective:    Patient ID: [name], male    DOB: Aug 22, 1924, 78 y.o.   MRN: [id number]  HPI [name] is a 78 y.o. male left hallus osteomyelitis s/p amputation 06/05/11  Cultures from amputated toe were positive for klebseilla pnemonaie. Ceftriaxone was then added in addition to vancomycin for mop-up therapy for osteomyelitis. Suprisingly, the patient is not known to have history of peripheral vascular disease nor diabetes.  final id recs included 4 wks of vancomycin and 2 wks of ceftriaxone for mop up of klebsiella. He was switched to bactrim DS BID for still having ongoing elevated inflammatory markers and his wound slowly healing. Since being at rehab, he states he has been participating with physical therapy, his wound still occasionally bleeds but no erythema like it was in the past. His dressings get changed twice a day. He denies any tenderness at his picc line. No fever/chills/nightsweats/nor diarrhea. He did have constipation which he feels is related to his pain medications which he is cutting back.   Current Outpatient Prescriptions on File Prior to Visit  Medication Sig Dispense Refill  . alendronate (FOSAMAX) 70 MG tablet Take 70 mg by mouth every 7 (seven) days. Take with a full glass of water on an empty stomach.      . Calcium Carbonate (CALCIUM 600 PO) Take 1 tablet by mouth daily.      . Multiple Vitamins-Minerals (MULTIVITAMINS THER. W/MINERALS) TABS Take 1 tablet by mouth daily.      . multivitamin (PROSIGHT) TABS Take 1 tablet by mouth daily.       Active Ambulatory Problems    Diagnosis Date Noted  . Osteomyelitis of toe of left foot 06/07/2011  . Cellulitis 06/07/2011  . Hypokalemia 06/07/2011  . Hyponatremia 06/07/2011  . MRSA (methicillin resistant staph aureus) culture positive 06/07/2011   Resolved Ambulatory Problems    Diagnosis Date Noted  . No Resolved Ambulatory Problems   Past Medical History  Diagnosis Date  . Pneumonia      Review of  Systems Review of Systems  Constitutional: Negative for fever, chills, diaphoresis, activity change, appetite change, fatigue and unexpected weight change.  HENT: Negative for congestion, sore throat, rhinorrhea, sneezing, trouble swallowing and sinus pressure.  Eyes: Negative for photophobia and visual disturbance.  Respiratory: Negative for cough, chest tightness, shortness of breath, wheezing and stridor.  Cardiovascular: Negative for chest pain, palpitations and leg swelling.  Gastrointestinal: Negative for nausea, vomiting, abdominal pain, diarrhea, constipation, blood in stool, abdominal distention and anal bleeding.  Genitourinary: Negative for dysuria, hematuria, flank pain and difficulty urinating.  Musculoskeletal: Negative for myalgias, back pain, joint swelling, arthralgias and gait problem.  Skin: Negative for color change, pallor, rash and wound.  Neurological: Negative for dizziness, tremors, weakness and light-headedness.  Hematological: Negative for adenopathy. Does not bruise/bleed easily.  Psychiatric/Behavioral: Negative for behavioral problems, confusion, sleep disturbance, dysphoric mood, decreased concentration and agitation.       Objective:   Physical Exam BP 132/77  Pulse 88  Temp 98.1 F (36.7 C) (Oral)  Ht 5\' 11"  (1.803 m)  Wt 165 lb 4 oz (74.957 kg)  BMI 23.05 kg/m2 Physical Exam  Constitutional: He is oriented to person, place, and time. He appears well-developed and well-nourished. No distress.  HENT:  Mouth/Throat: Oropharynx is clear and moist. No oropharyngeal exudate.  Cardiovascular: Normal rate, regular rhythm and normal heart sounds. Exam reveals no gallop and no friction rub.  No murmur heard.  Pulmonary/Chest: Effort normal and breath  sounds normal. No respiratory distress. He has no wheezes.  Abdominal: Soft. Bowel sounds are normal. He exhibits no distension. There is no tenderness.  Lymphadenopathy:  He has no cervical adenopathy.  Ext:  left foot dorsum of foot still has fibronous exudate at base of wound and surrounding erythema.  Skin: Skin is warm and dry. No rash noted. No erythema.  Psychiatric: He has a normal mood and affect. His behavior is normal.        Assessment & Plan:   deep tissue infection/cellulitis s/p amputation of left hallux = continue with bactrim DS BID since wound still appears to have some cellulitic appearance. Continue antibiotics until we see him again.  rtc in 4 wks.

## 2011-12-11 NOTE — Progress Notes (Signed)
Subjective:    Patient ID: [name], male    DOB: [birth year], 78 y.o.   MRN: [id number]  HPI [name] is a 78 y.o. 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 I X D on 3/24 where the abscess was debrided, toe nail removed, and removal of distal phalynx. Tissue cultures from that surgery revealed MRSA ( oxacillin R, vanco MIC 1). He subsequently underwent amputation of left hallux for optimal treatment of osteomyelitis on 06/05/11. Cultures from amputated toe were positive for klebseilla pnemonaie. Ceftriaxone was then added in addition to vancomycin for mop-up therapy for osteomyelitis.he is finishing 4 wks of mop-up therapy for osteomyelitis s/p amputation but his wound still is slowly healing has indurated erythema surrounding wound bed.  Current Outpatient Prescriptions on File Prior to Visit  Medication Sig Dispense Refill  . alendronate (FOSAMAX) 70 MG tablet Take 70 mg by mouth every 7 (seven) days. Take with a full glass of water on an empty stomach.      . Calcium Carbonate (CALCIUM 600 PO) Take 1 tablet by mouth daily.      . Multiple Vitamins-Minerals (MULTIVITAMINS THER. W/MINERALS) TABS Take 1 tablet by mouth daily.      . multivitamin (PROSIGHT) TABS Take 1 tablet by mouth daily.       Active Ambulatory Problems    Diagnosis Date Noted  . Osteomyelitis of toe of left foot 06/07/2011  . Cellulitis 06/07/2011  . Hypokalemia 06/07/2011  . Hyponatremia 06/07/2011  . MRSA (methicillin resistant staph aureus) culture positive 06/07/2011   Resolved Ambulatory Problems    Diagnosis Date Noted  . No Resolved Ambulatory Problems   Past Medical History  Diagnosis Date  . Pneumonia      Review of Systems Review of Systems  Constitutional: Negative for fever, chills, diaphoresis, activity change, appetite change, fatigue and unexpected weight change.  HENT: Negative for  congestion, sore throat, rhinorrhea, sneezing, trouble swallowing and sinus pressure.  Eyes: Negative for photophobia and visual disturbance.  Respiratory: Negative for cough, chest tightness, shortness of breath, wheezing and stridor.  Cardiovascular: Negative for chest pain, palpitations and leg swelling.  Gastrointestinal: Negative for nausea, vomiting, abdominal pain, diarrhea, constipation, blood in stool, abdominal distention and anal bleeding.  Genitourinary: Negative for dysuria, hematuria, flank pain and difficulty urinating.  Musculoskeletal: Negative for myalgias, back pain, joint swelling, arthralgias and gait problem.  Skin: Negative for color change, pallor, rash and wound.  Neurological: Negative for dizziness, tremors, weakness and light-headedness.  Hematological: Negative for adenopathy. Does not bruise/bleed easily.  Psychiatric/Behavioral: Negative for behavioral problems, confusion, sleep disturbance, dysphoric mood, decreased concentration and agitation.       Objective:   Physical Exam  BP 127/68  Pulse 99  Temp 97.6 F (36.4 C) (Oral)  Wt 162 lb (73.483 kg) Physical Exam  Constitutional: He is oriented to person, place, and time. He appears well-developed and well-nourished. No distress.  HENT:  Mouth/Throat: Oropharynx is clear and moist. No oropharyngeal exudate.  Cardiovascular: Normal rate, regular rhythm and normal heart sounds. Exam reveals no gallop and no friction rub.  No murmur heard.  Pulmonary/Chest: Effort normal and breath sounds normal. No respiratory distress. He has no wheezes.  Abdominal: Soft. Bowel sounds are normal. He exhibits no distension. There is no tenderness.  Lymphadenopathy:  He has no cervical adenopathy.  Ext = left hallux is amputated but still has an erythematous, poorly  healing would to dorsum of foot Skin: Skin is warm and dry. No rash noted. No erythema.  Psychiatric: He has a normal mood and affect. His behavior is  normal.       Assessment & Plan:  Deep tissue infection with MRSA and klebsiella s/p amputation of Left hallux osteomyelitis = on vancomycin ad ceftriaxone for mop up but his inflammatory markers are still elevated and wound very slow to heal. Will transition to bactrim DS 1 tab BID, which will cover pathogens identified in culture Will consider adding cipro if wound is not improving sufficinently in the next 4 wks rtc in 1 month

## 2012-10-29 ENCOUNTER — Encounter: Payer: Self-pay | Admitting: Cardiology

## 2012-11-07 ENCOUNTER — Ambulatory Visit (HOSPITAL_COMMUNITY)
Admission: RE | Admit: 2012-11-07 | Discharge: 2012-11-07 | Disposition: A | Discharge status: Home / Self Care | Payer: Medicare Other | Source: Ambulatory Visit | Attending: Cardiovascular Disease | Admitting: Cardiovascular Disease

## 2012-11-07 ENCOUNTER — Other Ambulatory Visit (HOSPITAL_COMMUNITY): Payer: Self-pay | Admitting: Internal Medicine

## 2012-11-07 DIAGNOSIS — R011 Cardiac murmur, unspecified: Secondary | ICD-10-CM

## 2012-11-07 NOTE — Progress Notes (Signed)
2D Echo Performed 11/07/2012    [name], RCS  

## 2012-11-08 LAB — IFOBT (OCCULT BLOOD): IFOBT: NEGATIVE

## 2013-04-22 ENCOUNTER — Ambulatory Visit (INDEPENDENT_AMBULATORY_CARE_PROVIDER_SITE_OTHER): Payer: Medicare Other | Admitting: Cardiology

## 2013-04-22 ENCOUNTER — Encounter: Payer: Self-pay | Admitting: Cardiology

## 2013-04-22 VITALS — BP 120/70 | HR 66 | Ht 70.5 in | Wt 183.0 lb

## 2013-04-22 DIAGNOSIS — R011 Cardiac murmur, unspecified: Secondary | ICD-10-CM

## 2013-04-22 NOTE — Patient Instructions (Signed)
Your physician recommends that you schedule a follow-up appointment in: AS NEEDED  

## 2013-04-22 NOTE — Progress Notes (Signed)
HPI The patient presents for evaluation of a murmur. He has no prior cardiac history but was noted to have a murmur on exam last year. I did review the results of an echo done in August of last year. He had a well preserved ejection fraction. However, there was some aortic valve thickening with a minimal gradient and some mild to moderate aortic insufficiency.  He has no symptoms related to this. He is an active gentleman and still does exercises. With these activities he denies any cardiovascular symptoms. The patient denies any new symptoms such as chest discomfort, neck or arm discomfort. There has been no new shortness of breath, PND or orthopnea. There have been no reported palpitations, presyncope or syncope.  He does have some right leg discomfort which has been chronic. This starts in his hip and goes down to his knees.  No Known Allergies  Current Outpatient Prescriptions  Medication Sig Dispense Refill  . alendronate (FOSAMAX) 70 MG tablet Take 70 mg by mouth every 7 (seven) days. Take with a full glass of water on an empty stomach.      Marland Kitchen aspirin 81 MG tablet Take 81 mg by mouth daily.      . Calcium Carbonate (CALCIUM 600 PO) Take 1 tablet by mouth daily.      . Multiple Vitamins-Minerals (MULTIVITAMINS THER. W/MINERALS) TABS Take 1 tablet by mouth daily.      . multivitamin (PROSIGHT) TABS Take 1 tablet by mouth daily.      . Omega-3 Fatty Acids (FISH OIL) 1000 MG CAPS Take 1 capsule by mouth daily.      . vitamin B-12 (CYANOCOBALAMIN) 500 MCG tablet Take 500 mcg by mouth daily.       No current facility-administered medications for this visit.    Past Medical History  Diagnosis Date  . Pneumonia   . Osteomyelitis of toe of left foot 06/07/2011  . MRSA (methicillin resistant staph aureus) culture positive 06/07/2011  . Osteoporosis     Past Surgical History  Procedure Laterality Date  . Left  rotator cuff repair    . Tonsillectomy    . Amputation  06/05/2011    Procedure:  AMPUTATION DIGIT;  Surgeon: Wylene Simmer, MD;  Location: [facility];  Service: Orthopedics;  Laterality: Left;  Left hallux amputation    Family History  Problem Relation Age of Onset  . Prostate cancer Son     History   Social History  . Marital Status: Widowed    Spouse Name: N/A    Number of Children: 2  . Years of Education: N/A   Occupational History  . Not on file.   Social History Main Topics  . Smoking status: Former Smoker    Types: Cigars    Quit date: 06/02/2011  . Smokeless tobacco: Never Used     Comment: Never inhaled  . Alcohol Use: No  . Drug Use: No  . Sexual Activity: No   Other Topics Concern  . Not on file   Social History Narrative   Lives alone    ROS:   As stated in the HPI and negative for all other systems.  PHYSICAL EXAM BP 120/70  Pulse 66  Ht 5' 10.5" (1.791 m)  Wt 183 lb (83.008 kg)  BMI 25.88 kg/m2 GENERAL:  Well appearing HEENT:  Pupils equal round and reactive, fundi not visualized, oral mucosa unremarkable NECK:  No jugular venous distention, waveform within normal limits, carotid upstroke brisk and symmetric, no bruits, no  thyromegaly LYMPHATICS:  No cervical, inguinal adenopathy LUNGS:  Clear to auscultation bilaterally BACK:  No CVA tenderness CHEST:  Unremarkable HEART:  PMI not displaced or sustained,S1 and S2 within normal limits, no S3, no S4, no clicks, no rubs, soft apical systolic murmur, no diastolic murmurs ABD:  Flat, positive bowel sounds normal in frequency in pitch, no bruits, no rebound, no guarding, no midline pulsatile mass, no hepatomegaly, no splenomegaly EXT:  2 plus pulses throughout, trace edema, no cyanosis no clubbing, mild venous stasis changes, missing left great toe.  SKIN:  No rashes no nodules NEURO:  Cranial nerves II through XII grossly intact, motor grossly intact throughout PSYCH:  Cognitively intact, oriented to person place and time   EKG:  Sinus rhythm, rate 66, biphasic P waves in leads 23  aVF suggestive of possible ectopic focus, probable left atrial enlargement.  ASSESSMENT AND PLAN  MURMUR:  He has some minimal aortic stenosis. I actually don't appreciate the diastolic murmur even with specific maneuvers. Regardless I don't think this letter be clinically significant and can be followed clinically with physical exams only. If he has any increasing murmur or symptoms he could be referred back for repeat echo I would be happy to further evaluate.  LEG PAIN:  I suspect a vascular etiology to this. This may the sciatic or arthritic.  No further cardiac workup is suggested.

## 2013-05-05 ENCOUNTER — Encounter: Payer: Self-pay | Admitting: Cardiology

## 2013-11-19 ENCOUNTER — Other Ambulatory Visit: Payer: Self-pay | Admitting: Orthopedic Surgery

## 2013-11-19 DIAGNOSIS — M25551 Pain in right hip: Secondary | ICD-10-CM

## 2013-11-19 DIAGNOSIS — M1611 Unilateral primary osteoarthritis, right hip: Secondary | ICD-10-CM

## 2013-11-20 ENCOUNTER — Ambulatory Visit
Admission: RE | Admit: 2013-11-20 | Discharge: 2013-11-20 | Disposition: A | Discharge status: Home / Self Care | Payer: Medicare Other | Source: Ambulatory Visit | Attending: Orthopedic Surgery | Admitting: Orthopedic Surgery

## 2013-11-20 DIAGNOSIS — M25551 Pain in right hip: Secondary | ICD-10-CM

## 2013-11-20 DIAGNOSIS — M1611 Unilateral primary osteoarthritis, right hip: Secondary | ICD-10-CM

## 2013-11-20 MED ORDER — IOHEXOL 180 MG/ML  SOLN
1.0000 mL | Freq: Once | INTRAMUSCULAR | Status: AC | PRN
  Administered 2013-11-20: 1 mL via INTRA_ARTICULAR

## 2013-11-20 MED ORDER — METHYLPREDNISOLONE ACETATE 40 MG/ML INJ SUSP (RADIOLOG
120.0000 mg | Freq: Once | INTRAMUSCULAR | Status: AC
  Administered 2013-11-20: 120 mg via INTRA_ARTICULAR

## 2014-03-10 ENCOUNTER — Telehealth: Payer: Self-pay | Admitting: Cardiology

## 2014-03-10 NOTE — Telephone Encounter (Signed)
Closed encounter °

## 2014-03-10 NOTE — Telephone Encounter (Signed)
Received records from [facility] (Dr Crist Infante) for appointment with Dr Percival Spanish on 03/23/14.  Records given to [facility] (medical records) for Dr Hochrein's schedule on 03/23/14.  lp

## 2014-03-17 ENCOUNTER — Ambulatory Visit (INDEPENDENT_AMBULATORY_CARE_PROVIDER_SITE_OTHER): Payer: Medicare Other | Admitting: Internal Medicine

## 2014-03-17 ENCOUNTER — Encounter: Payer: Self-pay | Admitting: Internal Medicine

## 2014-03-17 VITALS — BP 149/83 | HR 88 | Temp 97.4°F | Wt 165.0 lb

## 2014-03-17 DIAGNOSIS — A4902 Methicillin resistant Staphylococcus aureus infection, unspecified site: Secondary | ICD-10-CM

## 2014-03-17 MED ORDER — MUPIROCIN 2 % EX OINT: 1.0000 "application " | TOPICAL_OINTMENT | Freq: Three times a day (TID) | CUTANEOUS | Status: AC

## 2014-03-17 MED ORDER — CHLORHEXIDINE GLUCONATE 4 % EX SOLN
1.0000 | Freq: Every day | CUTANEOUS | Status: AC
Start: 2014-03-17 — End: ?

## 2014-03-17 NOTE — Progress Notes (Signed)
Subjective:    Patient ID: [name], male    DOB: 1924/08/17, 81 y.o.   MRN: [id number]  HPI   [name] is a pleasant 81yo M who we had seen for left foot osteomyelitis in the Fall of 2013. He had finished IV antibiotics plus oral suppression with bactrim. He was doing well up until early December 2015 where he sustained abrasian to right forearm developed what he describes as cellulitis and abscess. He was seen by his physician who did ixd and placed him on bactrim plus mupirocin, since he had small intranasal lesion + Cx MRSA. He is here in follow up to ensure he does not need further antibiotics since he has upcoming THA with [name] in mid-late feb, pre op appt in early feb.  Current Outpatient Prescriptions on File Prior to Visit  Medication Sig Dispense Refill  . alendronate (FOSAMAX) 70 MG tablet Take 70 mg by mouth every 7 (seven) days. Take with a full glass of water on an empty stomach.    Marland Kitchen aspirin 81 MG tablet Take 81 mg by mouth daily.    . Calcium Carbonate (CALCIUM 600 PO) Take 1 tablet by mouth daily.    . Multiple Vitamins-Minerals (MULTIVITAMINS THER. W/MINERALS) TABS Take 1 tablet by mouth daily.    . multivitamin (PROSIGHT) TABS Take 1 tablet by mouth daily.    . Omega-3 Fatty Acids (FISH OIL) 1000 MG CAPS Take 1 capsule by mouth daily.    . vitamin B-12 (CYANOCOBALAMIN) 500 MCG tablet Take 500 mcg by mouth daily.     No current facility-administered medications on file prior to visit.   Active Ambulatory Problems    Diagnosis Date Noted  . Osteomyelitis of toe of left foot 06/07/2011  . Cellulitis 06/07/2011  . Hypokalemia 06/07/2011  . Hyponatremia 06/07/2011  . MRSA (methicillin resistant staph aureus) culture positive 06/07/2011  . Murmur 04/22/2013  . Preop cardiovascular exam 03/23/2014   Resolved Ambulatory Problems    Diagnosis Date Noted  . No Resolved Ambulatory Problems   Past Medical History  Diagnosis Date  . Pneumonia   .  Osteoporosis      Review of Systems Review of Systems  Constitutional: Negative for fever, chills, diaphoresis, activity change, appetite change, fatigue and unexpected weight change.  HENT: Negative for congestion, sore throat, rhinorrhea, sneezing, trouble swallowing and sinus pressure.  Eyes: Negative for photophobia and visual disturbance.  Respiratory: Negative for cough, chest tightness, shortness of breath, wheezing and stridor.  Cardiovascular: Negative for chest pain, palpitations and leg swelling.  Gastrointestinal: Negative for nausea, vomiting, abdominal pain, diarrhea, constipation, blood in stool, abdominal distention and anal bleeding.  Genitourinary: Negative for dysuria, hematuria, flank pain and difficulty urinating.  Musculoskeletal: Negative for myalgias, back pain, joint swelling, arthralgias and gait problem.  Skin: Negative for color change, pallor, rash and wound.  Neurological: Negative for dizziness, tremors, weakness and light-headedness.  Hematological: Negative for adenopathy. Does not bruise/bleed easily.  Psychiatric/Behavioral: Negative for behavioral problems, confusion, sleep disturbance, dysphoric mood, decreased concentration and agitation.       Objective:   Physical Exam  BP 149/83 mmHg  Pulse 88  Temp(Src) 97.4 F (36.3 C) (Oral)  Wt 165 lb (74.844 kg) gen = a x o by 3 in nad Nares = no lesions visible Skin = Right forearm healed. Still small scab present      Assessment & Plan:  - would recommend that he uses mupirocin and chlorhexadine 7 days prior to  surgery as instructed - recommend he gets vanco plus cefazolin for pre op antibiotics for THA due to history of recurrent MRSA infection

## 2014-03-23 ENCOUNTER — Telehealth: Payer: Self-pay | Admitting: *Deleted

## 2014-03-23 ENCOUNTER — Ambulatory Visit (INDEPENDENT_AMBULATORY_CARE_PROVIDER_SITE_OTHER): Payer: Medicare Other | Admitting: Cardiology

## 2014-03-23 ENCOUNTER — Encounter: Payer: Self-pay | Admitting: Cardiology

## 2014-03-23 VITALS — BP 112/70 | HR 94 | Ht 70.0 in | Wt 171.6 lb

## 2014-03-23 DIAGNOSIS — Z01810 Encounter for preprocedural cardiovascular examination: Secondary | ICD-10-CM

## 2014-03-23 NOTE — Patient Instructions (Signed)
Your physician wants you to follow-up in: 1 Year. You will receive a reminder letter in the mail two months in advance. If you don't receive a letter, please call our office to schedule the follow-up appointment.  

## 2014-03-23 NOTE — Progress Notes (Signed)
HPI The patient presents for evaluation of a murmur and preop clearance. He has no prior cardiac history but was noted to have a murmur on exam last year. He had an echo in August 2014. He had a well preserved ejection fraction. However, there was some aortic valve thickening with a minimal gradient and some mild to moderate aortic insufficiency.  He has no symptoms related to this. He is an active gentleman and still does exercises and vacuums. With these activities he denies any cardiovascular symptoms. The patient denies any new symptoms such as chest discomfort, neck or arm discomfort. There has been no new shortness of breath, PND or orthopnea. There have been no reported palpitations, presyncope or syncope.  He has chronic leg pain and bilateral mild lower extremity edema.  No Known Allergies  Current Outpatient Prescriptions  Medication Sig Dispense Refill  . alendronate (FOSAMAX) 70 MG tablet Take 70 mg by mouth every 7 (seven) days. Take with a full glass of water on an empty stomach.    Marland Kitchen aspirin 81 MG tablet Take 81 mg by mouth daily.    . Calcium Carbonate (CALCIUM 600 PO) Take 1 tablet by mouth daily.    . Chlorhexidine Gluconate 4 % SOLN Apply 1 application topically daily. Apply head to toe body wash daily 5 days prior to surgery 237 mL 0  . Multiple Vitamins-Minerals (MULTIVITAMINS THER. W/MINERALS) TABS Take 1 tablet by mouth daily.    . multivitamin (PROSIGHT) TABS Take 1 tablet by mouth daily.    . mupirocin ointment (BACTROBAN) 2 % Place 1 application into the nose 3 (three) times daily. 5 days prior to surgery 22 g 0  . Omega-3 Fatty Acids (FISH OIL) 1000 MG CAPS Take 1 capsule by mouth daily.    . vitamin B-12 (CYANOCOBALAMIN) 500 MCG tablet Take 500 mcg by mouth daily.    . furosemide (LASIX) 20 MG tablet Take 1 tablet by mouth daily.  0   No current facility-administered medications for this visit.    Past Medical History  Diagnosis Date  . Pneumonia   .  Osteomyelitis of toe of left foot 06/07/2011  . MRSA (methicillin resistant staph aureus) culture positive 06/07/2011  . Osteoporosis     Past Surgical History  Procedure Laterality Date  . Left  rotator cuff repair    . Tonsillectomy    . Amputation  06/05/2011    Procedure: AMPUTATION DIGIT;  Surgeon: Wylene Simmer, MD;  Location: [facility];  Service: Orthopedics;  Laterality: Left;  Left hallux amputation    ROS:   As stated in the HPI and negative for all other systems.  PHYSICAL EXAM BP 112/70 mmHg  Pulse 94  Ht 5\' 10"  (1.778 m)  Wt 171 lb 9.6 oz (77.837 kg)  BMI 24.62 kg/m2 GENERAL:  Well appearing HEENT:  Pupils equal round and reactive, fundi not visualized, oral mucosa unremarkable NECK:  No jugular venous distention, waveform within normal limits, carotid upstroke brisk and symmetric, no bruits, no thyromegaly LYMPHATICS:  No cervical, inguinal adenopathy LUNGS:  Clear to auscultation bilaterally BACK:  No CVA tenderness CHEST:  Unremarkable HEART:  PMI not displaced or sustained,S1 and S2 within normal limits, no S3, no S4, no clicks, no rubs, soft apical systolic murmur, no diastolic murmurs ABD:  Flat, positive bowel sounds normal in frequency in pitch, no bruits, no rebound, no guarding, no midline pulsatile mass, no hepatomegaly, no splenomegaly EXT:  2 plus pulses throughout, mild bilateral edema, no cyanosis no  clubbing, mild venous stasis changes, missing left great toe.  SKIN:  No rashes no nodules NEURO:  Cranial nerves II through XII grossly intact, motor grossly intact throughout PSYCH:  Cognitively intact, oriented to person place and time   EKG:  Sinus rhythm, Rate 94, axis within normal limits, intervals within normal limits, premature ventricular contractions.  03/23/2014  ASSESSMENT AND PLAN  PREOP:  The patient has no high-risk findings. He does have some risk for difficulties with surgery because of his advanced age and slightly frail state. However, there  are no high-risk cardiac findings we'll contraindications. Further vascular testing is not indicated.  Therefore, based on ACC/AHA guidelines, the patient would be at acceptable risk for the planned procedure without further cardiovascular testing.  MURMUR:  He has some minimal aortic stenosis.  I don't suspect that this is worse. No further evaluation is indicated.  EDEMA:  I suspect venous insufficiency. We talked about conservative therapies.

## 2014-03-23 NOTE — Telephone Encounter (Signed)
Office note containing clearance hand delivered.

## 2014-04-05 ENCOUNTER — Encounter (HOSPITAL_COMMUNITY): Payer: Self-pay | Admitting: Emergency Medicine

## 2014-04-05 ENCOUNTER — Emergency Department (HOSPITAL_COMMUNITY): Payer: Medicare Other

## 2014-04-05 ENCOUNTER — Inpatient Hospital Stay (HOSPITAL_COMMUNITY)
Admission: EM | Admit: 2014-04-05 | Discharge: 2014-04-09 | DRG: 470 | Disposition: A | Discharge status: Skilled Nursing Facility | Payer: Medicare Other | Attending: Internal Medicine | Admitting: Internal Medicine

## 2014-04-05 ENCOUNTER — Inpatient Hospital Stay (HOSPITAL_COMMUNITY): Payer: Medicare Other

## 2014-04-05 DIAGNOSIS — M24851 Other specific joint derangements of right hip, not elsewhere classified: Secondary | ICD-10-CM | POA: Diagnosis present

## 2014-04-05 DIAGNOSIS — M869 Osteomyelitis, unspecified: Secondary | ICD-10-CM | POA: Diagnosis present

## 2014-04-05 DIAGNOSIS — Z87891 Personal history of nicotine dependence: Secondary | ICD-10-CM | POA: Diagnosis not present

## 2014-04-05 DIAGNOSIS — R531 Weakness: Secondary | ICD-10-CM

## 2014-04-05 DIAGNOSIS — M25551 Pain in right hip: Secondary | ICD-10-CM | POA: Diagnosis present

## 2014-04-05 DIAGNOSIS — D649 Anemia, unspecified: Secondary | ICD-10-CM | POA: Diagnosis present

## 2014-04-05 DIAGNOSIS — Z79899 Other long term (current) drug therapy: Secondary | ICD-10-CM

## 2014-04-05 DIAGNOSIS — M810 Age-related osteoporosis without current pathological fracture: Secondary | ICD-10-CM | POA: Diagnosis present

## 2014-04-05 DIAGNOSIS — Z89422 Acquired absence of other left toe(s): Secondary | ICD-10-CM | POA: Diagnosis not present

## 2014-04-05 DIAGNOSIS — R627 Adult failure to thrive: Secondary | ICD-10-CM | POA: Diagnosis present

## 2014-04-05 DIAGNOSIS — M25559 Pain in unspecified hip: Secondary | ICD-10-CM | POA: Diagnosis present

## 2014-04-05 DIAGNOSIS — Z9181 History of falling: Secondary | ICD-10-CM | POA: Diagnosis not present

## 2014-04-05 DIAGNOSIS — Z7982 Long term (current) use of aspirin: Secondary | ICD-10-CM

## 2014-04-05 DIAGNOSIS — D508 Other iron deficiency anemias: Secondary | ICD-10-CM | POA: Insufficient documentation

## 2014-04-05 DIAGNOSIS — M1611 Unilateral primary osteoarthritis, right hip: Secondary | ICD-10-CM | POA: Diagnosis present

## 2014-04-05 DIAGNOSIS — Z01811 Encounter for preprocedural respiratory examination: Secondary | ICD-10-CM

## 2014-04-05 DIAGNOSIS — Z96649 Presence of unspecified artificial hip joint: Secondary | ICD-10-CM

## 2014-04-05 HISTORY — DX: Cardiac murmur, unspecified: R01.1

## 2014-04-05 HISTORY — DX: Atherosclerotic heart disease of native coronary artery without angina pectoris: I25.10

## 2014-04-05 LAB — BASIC METABOLIC PANEL
ANION GAP: 8 (ref 5–15)
BUN: 17 mg/dL (ref 6–23)
CO2: 26 mmol/L (ref 19–32)
Calcium: 8.3 mg/dL — ABNORMAL LOW (ref 8.4–10.5)
Chloride: 103 mmol/L (ref 96–112)
Creatinine, Ser: 0.93 mg/dL (ref 0.50–1.35)
GFR calc non Af Amer: 73 mL/min — ABNORMAL LOW (ref >90)
GFR, EST AFRICAN AMERICAN: 84 mL/min — AB (ref >90)
Glucose, Bld: 92 mg/dL (ref 70–99)
Potassium: 3.8 mmol/L (ref 3.5–5.1)
Sodium: 137 mmol/L (ref 135–145)

## 2014-04-05 LAB — URINALYSIS, ROUTINE W REFLEX MICROSCOPIC
Bilirubin Urine: NEGATIVE
Glucose, UA: NEGATIVE mg/dL
Ketones, ur: 15 mg/dL — AB
Leukocytes, UA: NEGATIVE
Nitrite: NEGATIVE
PH: 6 (ref 5.0–8.0)
PROTEIN: 30 mg/dL — AB
SPECIFIC GRAVITY, URINE: 1.026 (ref 1.005–1.030)
UROBILINOGEN UA: 1 mg/dL (ref 0.0–1.0)

## 2014-04-05 LAB — CBC WITH DIFFERENTIAL/PLATELET
BASOS ABS: 0 10*3/uL (ref 0.0–0.1)
Basophils Relative: 0 % (ref 0–1)
EOS ABS: 0 10*3/uL (ref 0.0–0.7)
Eosinophils Relative: 0 % (ref 0–5)
HCT: 36 % — ABNORMAL LOW (ref 39.0–52.0)
Hemoglobin: 11.8 g/dL — ABNORMAL LOW (ref 13.0–17.0)
LYMPHS ABS: 1.1 10*3/uL (ref 0.7–4.0)
Lymphocytes Relative: 9 % — ABNORMAL LOW (ref 12–46)
MCH: 30.8 pg (ref 26.0–34.0)
MCHC: 32.8 g/dL (ref 30.0–36.0)
MCV: 94 fL (ref 78.0–100.0)
Monocytes Absolute: 0.7 10*3/uL (ref 0.1–1.0)
Monocytes Relative: 6 % (ref 3–12)
NEUTROS ABS: 10.1 10*3/uL — AB (ref 1.7–7.7)
NEUTROS PCT: 85 % — AB (ref 43–77)
Platelets: 303 10*3/uL (ref 150–400)
RBC: 3.83 MIL/uL — AB (ref 4.22–5.81)
RDW: 15 % (ref 11.5–15.5)
WBC: 12 10*3/uL — ABNORMAL HIGH (ref 4.0–10.5)

## 2014-04-05 LAB — URINE MICROSCOPIC-ADD ON

## 2014-04-05 LAB — SURGICAL PCR SCREEN
MRSA, PCR: NEGATIVE
STAPHYLOCOCCUS AUREUS: NEGATIVE

## 2014-04-05 MED ORDER — POLYETHYLENE GLYCOL 3350 17 G PO PACK: 17.0000 g | PACK | Freq: Every day | ORAL | Status: DC | PRN

## 2014-04-05 MED ORDER — FISH OIL 1000 MG PO CAPS: 1.0000 | ORAL_CAPSULE | Freq: Every day | ORAL | Status: DC

## 2014-04-05 MED ORDER — FUROSEMIDE 20 MG PO TABS
20.0000 mg | ORAL_TABLET | Freq: Every day | ORAL | Status: DC
  Administered 2014-04-05: 20 mg via ORAL
  Filled 2014-04-05 (×2): qty 1

## 2014-04-05 MED ORDER — MORPHINE SULFATE 2 MG/ML IJ SOLN
0.5000 mg | INTRAMUSCULAR | Status: DC | PRN
  Administered 2014-04-07 (×2): 0.5 mg via INTRAVENOUS
  Filled 2014-04-05 (×2): qty 1

## 2014-04-05 MED ORDER — VITAMIN B-12 500 MCG PO TABS: 500.0000 ug | ORAL_TABLET | Freq: Every day | ORAL | Status: DC

## 2014-04-05 MED ORDER — THERA M PLUS PO TABS: 1.0000 | ORAL_TABLET | Freq: Every day | ORAL | Status: DC

## 2014-04-05 MED ORDER — ASPIRIN EC 81 MG PO TBEC
81.0000 mg | DELAYED_RELEASE_TABLET | Freq: Every day | ORAL | Status: DC
  Administered 2014-04-05: 81 mg via ORAL
  Filled 2014-04-05 (×2): qty 1

## 2014-04-05 MED ORDER — ENOXAPARIN SODIUM 40 MG/0.4ML ~~LOC~~ SOLN
40.0000 mg | SUBCUTANEOUS | Status: DC
Start: 2014-04-05 — End: 2014-04-06
  Filled 2014-04-05 (×2): qty 0.4

## 2014-04-05 MED ORDER — CYANOCOBALAMIN 500 MCG PO TABS
500.0000 ug | ORAL_TABLET | Freq: Every day | ORAL | Status: DC
  Administered 2014-04-05 – 2014-04-09 (×4): 500 ug via ORAL
  Filled 2014-04-05 (×5): qty 1

## 2014-04-05 MED ORDER — ADULT MULTIVITAMIN W/MINERALS CH
1.0000 | ORAL_TABLET | Freq: Every day | ORAL | Status: DC
  Filled 2014-04-05: qty 1

## 2014-04-05 MED ORDER — HYDROCODONE-ACETAMINOPHEN 5-325 MG PO TABS
1.0000 | ORAL_TABLET | Freq: Four times a day (QID) | ORAL | Status: DC | PRN
  Administered 2014-04-05 – 2014-04-07 (×6): 2 via ORAL
  Filled 2014-04-05 (×7): qty 2

## 2014-04-05 MED ORDER — SODIUM CHLORIDE 0.9 % IV SOLN
INTRAVENOUS | Status: AC
  Administered 2014-04-05 – 2014-04-06 (×2): via INTRAVENOUS

## 2014-04-05 MED ORDER — INFLUENZA VAC SPLIT QUAD 0.5 ML IM SUSY
0.5000 mL | PREFILLED_SYRINGE | INTRAMUSCULAR | Status: DC
  Filled 2014-04-05 (×3): qty 0.5

## 2014-04-05 MED ORDER — OMEGA-3-ACID ETHYL ESTERS 1 G PO CAPS
1.0000 g | ORAL_CAPSULE | Freq: Every day | ORAL | Status: DC
  Administered 2014-04-06 – 2014-04-09 (×3): 1 g via ORAL
  Filled 2014-04-05 (×5): qty 1

## 2014-04-05 MED ORDER — HYDROCODONE-ACETAMINOPHEN 5-325 MG PO TABS
1.0000 | ORAL_TABLET | Freq: Once | ORAL | Status: AC
  Administered 2014-04-05: 1 via ORAL
  Filled 2014-04-05: qty 1

## 2014-04-05 MED ORDER — FERROUS SULFATE 325 (65 FE) MG PO TABS
325.0000 mg | ORAL_TABLET | Freq: Three times a day (TID) | ORAL | Status: DC
  Administered 2014-04-05 – 2014-04-09 (×5): 325 mg via ORAL
  Filled 2014-04-05 (×14): qty 1

## 2014-04-05 MED ORDER — PROSIGHT PO TABS
1.0000 | ORAL_TABLET | Freq: Every day | ORAL | Status: DC
  Administered 2014-04-05 – 2014-04-09 (×4): 1 via ORAL
  Filled 2014-04-05 (×5): qty 1

## 2014-04-05 NOTE — ED Notes (Signed)
Pt from home via [facility] c/o R hip pain. Per EMS, pt reports that he is supposed to get a R hip replacement next month. Pt c/o pain that is preventing him from ambulating and eating. Pt adds that he has in eaten in three days. Pt is A&O and in NAD. Per EMS-pt is poor historian. Pt reports that his so is supposed to come here from [place] this evening.

## 2014-04-05 NOTE — ED Provider Notes (Signed)
CSN: [id number]     Arrival date & time 04/05/14  1213 History   First MD Initiated Contact with Patient 04/05/14 1249     Chief Complaint  Patient presents with  . Hip Pain     (Consider location/radiation/quality/duration/timing/severity/associated sxs/prior Treatment) HPI Comments: Patient presents with right hip pain. He has chronic hip pain is waiting for hip replacement. His hip replacement has been scheduled for the end of February but he states that the pain is been worsening over last 2-3 weeks. He states that it is to the point where he can't walk on it. He takes Ultram for pain. He denies any fevers or other recent illnesses. He states his appetite is been diminished because of the pain. He's wants to have the surgery moved up. He feels like he needs to be admitted to the hospital for pain control or admitted to assisted care facility.  Patient is a 81 y.o. male presenting with hip pain.  Hip Pain Pertinent negatives include no headaches.    Past Medical History  Diagnosis Date  . Pneumonia   . Osteomyelitis of toe of left foot 06/07/2011  . MRSA (methicillin resistant staph aureus) culture positive 06/07/2011  . Osteoporosis    Past Surgical History  Procedure Laterality Date  . Left  rotator cuff repair    . Tonsillectomy    . Amputation  06/05/2011    Procedure: AMPUTATION DIGIT;  Surgeon: Wylene Simmer, MD;  Location: [facility];  Service: Orthopedics;  Laterality: Left;  Left hallux amputation   Family History  Problem Relation Age of Onset  . Prostate cancer Son    History  Substance Use Topics  . Smoking status: Former Smoker    Types: Cigars    Quit date: 06/02/2011  . Smokeless tobacco: Never Used     Comment: Never inhaled  . Alcohol Use: No    Review of Systems  Constitutional: Negative for fever.  Gastrointestinal: Negative for nausea and vomiting.  Musculoskeletal: Positive for arthralgias. Negative for back pain, joint swelling and neck pain.  Skin:  Negative for wound.  Neurological: Negative for weakness, numbness and headaches.      Allergies  Review of patient's allergies indicates no known allergies.  Home Medications   Prior to Admission medications   Medication Sig Start Date End Date Taking? Authorizing Provider  alendronate (FOSAMAX) 70 MG tablet Take 70 mg by mouth every 7 (seven) days. Take with a full glass of water on an empty stomach.   Yes Historical Provider, MD  aspirin 81 MG tablet Take 81 mg by mouth daily.   Yes Historical Provider, MD  Calcium Carbonate (CALCIUM 600 PO) Take 1 tablet by mouth daily.   Yes Historical Provider, MD  Chlorhexidine Gluconate 4 % SOLN Apply 1 application topically daily. Apply head to toe body wash daily 5 days prior to surgery 03/17/14  Yes Carlyle Basques, MD  furosemide (LASIX) 20 MG tablet Take 1 tablet by mouth daily. 02/12/14  Yes Historical Provider, MD  Multiple Vitamins-Minerals (MULTIVITAMINS THER. W/MINERALS) TABS Take 1 tablet by mouth daily.   Yes Historical Provider, MD  multivitamin (PROSIGHT) TABS Take 1 tablet by mouth daily.   Yes Historical Provider, MD  mupirocin ointment (BACTROBAN) 2 % Place 1 application into the nose 3 (three) times daily. 5 days prior to surgery 03/17/14  Yes Carlyle Basques, MD  naproxen sodium (ANAPROX) 220 MG tablet Take 220 mg by mouth daily.   Yes Historical Provider, MD  Omega-3 Fatty Acids (  FISH OIL) 1000 MG CAPS Take 1 capsule by mouth daily.   Yes Historical Provider, MD  sulfamethoxazole-trimethoprim (BACTRIM DS,SEPTRA DS) 800-160 MG per tablet Take 1 tablet by mouth 2 (two) times daily.   Yes Historical Provider, MD  traMADol (ULTRAM) 50 MG tablet Take 50 mg by mouth every 6 (six) hours as needed.   Yes Historical Provider, MD  vitamin B-12 (CYANOCOBALAMIN) 500 MCG tablet Take 500 mcg by mouth daily.   Yes Historical Provider, MD   BP 133/74 mmHg  Pulse 95  Temp(Src) 97.4 F (36.3 C) (Oral)  Resp 19  SpO2 96% Physical Exam   Constitutional: He is oriented to person, place, and time. He appears well-developed and well-nourished.  HENT:  Head: Normocephalic and atraumatic.  Eyes: Pupils are equal, round, and reactive to light.  Neck: Normal range of motion. Neck supple.  Cardiovascular: Normal rate, regular rhythm and normal heart sounds.   Pulmonary/Chest: Effort normal and breath sounds normal. No respiratory distress. He has no wheezes. He has no rales. He exhibits no tenderness.  Abdominal: Soft. Bowel sounds are normal. There is no tenderness. There is no rebound and no guarding.  Musculoskeletal: Normal range of motion. He exhibits edema.  Positive tenderness with range of motion of the right hip. There is no erythema or swelling around the joint. Pedal pulses are intact. He has some trace edema to the legs bilaterally.  Lymphadenopathy:    He has no cervical adenopathy.  Neurological: He is alert and oriented to person, place, and time.  Skin: Skin is warm and dry. No rash noted.  Psychiatric: He has a normal mood and affect.    ED Course  Procedures (including critical care time) Labs Review Labs Reviewed  BASIC METABOLIC PANEL - Abnormal; Notable for the following:    Calcium 8.3 (*)    GFR calc non Af Amer 73 (*)    GFR calc Af Amer 84 (*)    All other components within normal limits  CBC WITH DIFFERENTIAL/PLATELET - Abnormal; Notable for the following:    WBC 12.0 (*)    RBC 3.83 (*)    Hemoglobin 11.8 (*)    HCT 36.0 (*)    Neutrophils Relative % 85 (*)    Neutro Abs 10.1 (*)    Lymphocytes Relative 9 (*)    All other components within normal limits  URINALYSIS, ROUTINE W REFLEX MICROSCOPIC - Abnormal; Notable for the following:    Color, Urine AMBER (*)    APPearance TURBID (*)    Hgb urine dipstick MODERATE (*)    Ketones, ur 15 (*)    Protein, ur 30 (*)    All other components within normal limits  URINE MICROSCOPIC-ADD ON - Abnormal; Notable for the following:    Crystals URIC  ACID CRYSTALS (*)    All other components within normal limits    Imaging Review Dg Hip Unilat With Pelvis 2-3 Views Right  04/05/2014   CLINICAL DATA:  Right hip pain for 1 year following a previous injury, recent fall with increasing hip pain  EXAM: DG HIP W/ PELVIS 2-3V*R*  COMPARISON:  None.  FINDINGS: The pelvic ring is intact. There are chronic changes involving the right proximal femur with prior fracture and breakdown of the femoral head and femoral neck. The femur is displaced superiorly along the superior lateral aspect of the acetabulum. Some dystrophic calcifications are seen. No gross soft tissue abnormality is noted.  IMPRESSION: Changes consistent with prior fracture with significant remodeling  of the proximal femur and superior subluxation. No definitive acute fracture is identified on this exam.   Electronically Signed   By: Inez Catalina M.D.   On: 04/05/2014 14:08     EKG Interpretation None      MDM   Final diagnoses:  Hip pain    Patient is unable to ambulate. He is having worsening of his chronic pain in his right hip. He has no fevers or acute injuries. His x-rays show degenerative changes but no acute fracture. He has a slightly elevated white count but no fever. He has no evidence of a urinary tract infection and no symptoms that would be suggestive of pneumonia. Patient is requesting admission. I spoke with Dr. Maureen Ralphs with [facility] orthopedics who feels that there is a good chance the patient might be able to have his surgery moved up and even if this doesn't happen he felt like they could do an intra-articular injection always in the hospital. He's requesting that the patient be admitted by the hospitalist service. I talk with Dr. Reece Levy who will admit the patient.    Malvin Johns, MD 04/05/14 [phone]

## 2014-04-05 NOTE — ED Notes (Signed)
Pt from home reports that over the past week he has become non-ambulatory d/t R hip pain that needs replacing. Pt sts his neighbor comes over and helps him to BR. Pt adds that he has not eten in three days. Pt wants surgery moved up to today. Pt in NAD

## 2014-04-05 NOTE — Clinical Social Work Note (Signed)
CSW met with patient and son, Flynn Gwyn [phone], at bedside in ER. Patient will be admitted and is hopeful to have his scheduled hip surgery moved to this week instead of late February- per son and patient's report, he lives alone and has been unable to walk for the past few days- patient and son are planning for SNF at [facility] at d.c-  CSW covering tomorrow to assist further with dc planning/needs. [name], MSW, LCSWA [phone]/weekend coverage

## 2014-04-05 NOTE — Progress Notes (Signed)
Called for report, await return call from ED nurse

## 2014-04-05 NOTE — H&P (Signed)
Patient's PCP: Jerlyn Ly, MD  Chief Complaint: Right hip pain  History of Present Illness: [name] is a 81 y.o. 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  Patient reports that he has had chronic right hip pain for 4 years.  However over the last 4-3 weeks he has had worsening right hip pain.  He had followed up with his orthopedic physician, Dr. Ihor Gully, who has scheduled him to have surgery in February for hip replacement.  However, patient due to worsening right hip pain has been immobile and has had poor appetite at home.  He reports having fallen onto his knees 2 days ago denies hitting his head or losing consciousness.  He has been having his neighbors helping get up to even go to the bathroom.  Due to his inability to walk and worsening functional status and home, he presented to the emergency department for further care and management.  He has not had any recent fevers, chills, nausea, vomiting, chest pain, shortness of breath, abdominal pain, diarrhea, headaches or vision changes.  ED physician spoke with Dr. Wynelle Link, who recommended hospitalist service admit the patient for further care and management.    Review of Systems: All systems reviewed with the patient and positive as per history of present illness, otherwise all other systems are negative.  Past Medical History  Diagnosis Date  . Pneumonia   . Osteomyelitis of toe of left foot 06/07/2011  . MRSA (methicillin resistant staph aureus) culture positive 06/07/2011  . Osteoporosis    Past Surgical History  Procedure Laterality Date  . Left  rotator cuff repair    . Tonsillectomy    . Amputation  06/05/2011    Procedure: AMPUTATION DIGIT;  Surgeon: Wylene Simmer, MD;  Location: [facility];  Service: Orthopedics;  Laterality: Left;  Left hallux amputation   Family History  Problem Relation Age of Onset   . Prostate cancer Son   . Cancer Father    History   Social History  . Marital Status: Widowed    Spouse Name: N/A    Number of Children: 2  . Years of Education: N/A   Occupational History  . Not on file.   Social History Main Topics  . Smoking status: Former Smoker    Types: Cigars    Quit date: 06/02/2011  . Smokeless tobacco: Never Used     Comment: Never inhaled  . Alcohol Use: No  . Drug Use: No  . Sexual Activity: No   Other Topics Concern  . Not on file   Social History Narrative   Lives alone.  He has a purple heart from WWII and was injured in [place].  Of note his father also had a purple heart from WW[place] and was [place].    Allergies: Review of patient's allergies indicates no known allergies.  Home Meds: Prior to Admission medications   Medication Sig Start Date End Date Taking? Authorizing Provider  alendronate (FOSAMAX) 70 MG tablet Take 70 mg by mouth every 7 (seven) days. Take with a full glass of water on an empty stomach.   Yes Historical Provider, MD  aspirin 81 MG tablet Take 81 mg by mouth daily.   Yes Historical Provider, MD  Calcium Carbonate (CALCIUM 600 PO) Take 1 tablet by mouth daily.   Yes Historical Provider, MD  Chlorhexidine Gluconate 4 % SOLN Apply 1 application  topically daily. Apply head to toe body wash daily 5 days prior to surgery 03/17/14  Yes Carlyle Basques, MD  furosemide (LASIX) 20 MG tablet Take 1 tablet by mouth daily. 02/12/14  Yes Historical Provider, MD  Multiple Vitamins-Minerals (MULTIVITAMINS THER. W/MINERALS) TABS Take 1 tablet by mouth daily.   Yes Historical Provider, MD  multivitamin (PROSIGHT) TABS Take 1 tablet by mouth daily.   Yes Historical Provider, MD  mupirocin ointment (BACTROBAN) 2 % Place 1 application into the nose 3 (three) times daily. 5 days prior to surgery 03/17/14  Yes Carlyle Basques, MD  naproxen sodium (ANAPROX) 220 MG tablet Take 220 mg by mouth daily.   Yes Historical Provider, MD  Omega-3  Fatty Acids (FISH OIL) 1000 MG CAPS Take 1 capsule by mouth daily.   Yes Historical Provider, MD  sulfamethoxazole-trimethoprim (BACTRIM DS,SEPTRA DS) 800-160 MG per tablet Take 1 tablet by mouth 2 (two) times daily.   Yes Historical Provider, MD  traMADol (ULTRAM) 50 MG tablet Take 50 mg by mouth every 6 (six) hours as needed.   Yes Historical Provider, MD  vitamin B-12 (CYANOCOBALAMIN) 500 MCG tablet Take 500 mcg by mouth daily.   Yes Historical Provider, MD    Physical Exam: Blood pressure 133/74, pulse 95, temperature 97.4 F (36.3 C), temperature source Oral, resp. rate 19, SpO2 96 %. General: Awake, Oriented x3, No acute distress. HEENT: EOMI, Moist mucous membranes, dentures in place. Neck: Supple CV: S1 and S2, soft systolic murmur.   Lungs: Clear to ascultation bilaterally Abdomen: Soft, Nontender, Nondistended, +bowel sounds. Ext: Good pulses. Trace edema. No clubbing or cyanosis noted, status post left toe amputation.   Neuro: Cranial Nerves II-XII grossly intact. Has 5/5 motor strength in upper and lower extremities.  Lab results:  Recent Labs  04/05/14 1329  NA 137  K 3.8  CL 103  CO2 26  GLUCOSE 92  BUN 17  CREATININE 0.93  CALCIUM 8.3*   No results for input(s): AST, ALT, ALKPHOS, BILITOT, PROT, ALBUMIN in the last 72 hours. No results for input(s): LIPASE, AMYLASE in the last 72 hours.  Recent Labs  04/05/14 1329  WBC 12.0*  NEUTROABS 10.1*  HGB 11.8*  HCT 36.0*  MCV 94.0  PLT 303   No results for input(s): CKTOTAL, CKMB, CKMBINDEX, TROPONINI in the last 72 hours. Invalid input(s): POCBNP No results for input(s): DDIMER in the last 72 hours. No results for input(s): HGBA1C in the last 72 hours. No results for input(s): CHOL, HDL, LDLCALC, TRIG, CHOLHDL, LDLDIRECT in the last 72 hours. No results for input(s): TSH, T4TOTAL, T3FREE, THYROIDAB in the last 72 hours.  Invalid input(s): FREET3 No results for input(s): VITAMINB12, FOLATE, FERRITIN, TIBC,  IRON, RETICCTPCT in the last 72 hours. Imaging results:  Dg Hip Unilat With Pelvis 2-3 Views Right  04/05/2014   CLINICAL DATA:  Right hip pain for 1 year following a previous injury, recent fall with increasing hip pain  EXAM: DG HIP W/ PELVIS 2-3V*R*  COMPARISON:  None.  FINDINGS: The pelvic ring is intact. There are chronic changes involving the right proximal femur with prior fracture and breakdown of the femoral head and femoral neck. The femur is displaced superiorly along the superior lateral aspect of the acetabulum. Some dystrophic calcifications are seen. No gross soft tissue abnormality is noted.  IMPRESSION: Changes consistent with prior fracture with significant remodeling of the proximal femur and superior subluxation. No definitive acute fracture is identified on this exam.   Electronically Signed  By: Inez Catalina M.D.   On: 04/05/2014 14:08    Assessment & Plan by Problem: Right hip pain Likely due to osteoarthritis.  Right hip x-ray shows finding consistent with prior fracture with significant remodeling of proximal femur and superior subluxation. Patient indicates that he has been unaware of old fracture. Further management as per ortho.  Patient made nothing by mouth after midnight in anticipation for any surgical procedure possibly tomorrow.  Requested PT, OT evaluation.  Pain control with pain medications. Of note, patient was recently evaluated by cardiology Dr. Percival Spanish on 03/23/2014 patient was deemed "based on ACC/AHA guidelines, the patient would be at acceptable risk for the planned procedure without further cardiovascular testing."  Generalized weakness Due to the above.  Continue PT/OT.  Failure to thrive Due to above.  Requested dietary consultation for assessing patient's nutritional status.  Anemia Check anemia panel in the morning.  Started patient on supplemental iron.  Osteoporosis Resume bisphosphonates and calcium after discharge.  Prophylaxis   Lovenox.  CODE STATUS Full code.  This was discussed with patient and family at the time of admission.  Disposition Admit as inpatient to medical bed.  Time spent on admission, talking to the patient, and coordinating care was: 50 mins.  Nira Visscher A, MD 04/05/2014, 4:55 PM

## 2014-04-05 NOTE — Progress Notes (Signed)
CARE MANAGEMENT NOTE 04/05/2014  Patient:  CARMICHAEL, BURDETTE   Account Number:  [id number]  Date Initiated:  04/05/2014  Documentation initiated by:  [facility]  Subjective/Objective Assessment:   hip pain     Action/Plan:   lives alone   Anticipated DC Date:     Anticipated DC Plan:  [place] referral  Clinical Social Worker      DC Planning Services  CM consult      Choice offered to / List presented to:             Status of service:  In process, will continue to follow Medicare Important Message given?   (If response is "NO", the following Medicare IM given date fields will be blank) Date Medicare IM given:   Medicare IM given by:   Date Additional Medicare IM given:   Additional Medicare IM given by:    Discharge Disposition:    Per UR Regulation:    If discussed at Long Length of Stay Meetings, dates discussed:    Comments:  04/05/2014 1515 NCM spoke to pt and states he is unable to walk. Gave permission to speak to son, Hoyte Ziebell # [phone]. States he lives in [place] and drove in today to check on pt. States his pt lives alone. NCM spoke to attending and pt will be evaluated for pain and gait. PT eval consult. CSW referral to discuss with son SNF.  Jonnie Finner RN CCM Case Mgmt phone [phone]

## 2014-04-05 NOTE — ED Notes (Signed)
Bed: WA22 Expected date:  Expected time:  Means of arrival:  Comments: ems 

## 2014-04-05 NOTE — ED Notes (Signed)
Bed: GL87 Expected date: 04/05/14 Expected time: 12:06 PM Means of arrival: Ambulance Comments: Hip pain

## 2014-04-06 LAB — VITAMIN B12: Vitamin B-12: 1340 pg/mL — ABNORMAL HIGH (ref 211–911)

## 2014-04-06 LAB — CBC
HEMATOCRIT: 34.9 % — AB (ref 39.0–52.0)
Hemoglobin: 11.3 g/dL — ABNORMAL LOW (ref 13.0–17.0)
MCH: 30.7 pg (ref 26.0–34.0)
MCHC: 32.4 g/dL (ref 30.0–36.0)
MCV: 94.8 fL (ref 78.0–100.0)
PLATELETS: 264 10*3/uL (ref 150–400)
RBC: 3.68 MIL/uL — ABNORMAL LOW (ref 4.22–5.81)
RDW: 15.1 % (ref 11.5–15.5)
WBC: 9.6 10*3/uL (ref 4.0–10.5)

## 2014-04-06 LAB — BASIC METABOLIC PANEL
ANION GAP: 8 (ref 5–15)
BUN: 16 mg/dL (ref 6–23)
CHLORIDE: 105 mmol/L (ref 96–112)
CO2: 27 mmol/L (ref 19–32)
CREATININE: 0.85 mg/dL (ref 0.50–1.35)
Calcium: 8.1 mg/dL — ABNORMAL LOW (ref 8.4–10.5)
GFR calc Af Amer: 88 mL/min — ABNORMAL LOW (ref >90)
GFR calc non Af Amer: 76 mL/min — ABNORMAL LOW (ref >90)
Glucose, Bld: 88 mg/dL (ref 70–99)
Potassium: 4.6 mmol/L (ref 3.5–5.1)
SODIUM: 140 mmol/L (ref 135–145)

## 2014-04-06 LAB — FERRITIN: Ferritin: 587 ng/mL — ABNORMAL HIGH (ref 22–322)

## 2014-04-06 LAB — CLOSTRIDIUM DIFFICILE BY PCR: Toxigenic C. Difficile by PCR: NEGATIVE

## 2014-04-06 LAB — IRON AND TIBC
Iron: <10 ug/dL — ABNORMAL LOW (ref 42–165)
UIBC: >575 ug/dL — ABNORMAL HIGH (ref 125–400)

## 2014-04-06 LAB — RETICULOCYTES
RBC.: 3.68 MIL/uL — AB (ref 4.22–5.81)
RETIC COUNT ABSOLUTE: 66.2 10*3/uL (ref 19.0–186.0)
Retic Ct Pct: 1.8 % (ref 0.4–3.1)

## 2014-04-06 LAB — FOLATE: Folate: 13 ng/mL

## 2014-04-06 MED ORDER — ENSURE COMPLETE PO LIQD
237.0000 mL | Freq: Two times a day (BID) | ORAL | Status: DC
  Administered 2014-04-08 – 2014-04-09 (×3): 237 mL via ORAL

## 2014-04-06 NOTE — Progress Notes (Signed)
[facility] PROGRESS NOTE  [name] BVQ:[id number] DOB: [birth year] DOA: 04/05/2014 PCP: Jerlyn Ly, MD  Assessment/Plan: 81 y/o male with PMH of DJD, h/o L toe osteomyelitis, MRSA, osteoporosis presented with R hip pain, inability to ambulate;   1. Right hip pain suspected due to osteoarthritis. Right hip x-ray shows finding consistent with prior fracture with significant remodeling of proximal femur and superior subluxation.  -per ortho surgery planned for 1/26; Requested PT, OT evaluation. -patient denies any cardiopulmonary symptoms; hold diuretic periop; Of note, patient was recently evaluated by cardiology Dr. Percival Spanish on 03/23/2014 patient was deemed "based on ACC/AHA guidelines, the patient would be at acceptable risk for the planned procedure without further cardiovascular testing."  2. Generalized weakness Due to the above. Continue PT/OT. 3. Failure to thrive Due to above. Requested dietary consultation for assessing patient's nutritional status. 4. Anemia Check anemia panel in the morning. Started patient on supplemental iron. 5. Osteoporosis Resume bisphosphonates and calcium after discharge.  Code Status: full Family Communication: d/w patient, his son  (indicate person spoken with, relationship, and if by phone, the number) Disposition Plan: pend surgery    Consultants:  ortho  Procedures:  Pend surgery   Antibiotics:  none (indicate start date, and stop date if known)  HPI/Subjective: alert  Objective: Filed Vitals:   04/06/14 0522  BP: 133/71  Pulse: 73  Temp: 98.3 F (36.8 C)  Resp: 20    Intake/Output Summary (Last 24 hours) at 04/06/14 1129 Last data filed at 04/06/14 0900  Gross per 24 hour  Intake   1290 ml  Output    750 ml  Net    540 ml   Filed Weights   04/05/14 1851  Weight: 77.837 kg (171 lb 9.6 oz)    Exam:   General:  alert  Cardiovascular: s1,s2 rrr  Respiratory: CTA BL  Abdomen: soft, nt,nd    Musculoskeletal: no LE edema   Data Reviewed: Basic Metabolic Panel:  Recent Labs Lab 04/05/14 1329 04/06/14 0505  NA 137 140  K 3.8 4.6  CL 103 105  CO2 26 27  GLUCOSE 92 88  BUN 17 16  CREATININE 0.93 0.85  CALCIUM 8.3* 8.1*   Liver Function Tests: No results for input(s): AST, ALT, ALKPHOS, BILITOT, PROT, ALBUMIN in the last 168 hours. No results for input(s): LIPASE, AMYLASE in the last 168 hours. No results for input(s): AMMONIA in the last 168 hours. CBC:  Recent Labs Lab 04/05/14 1329 04/06/14 0505  WBC 12.0* 9.6  NEUTROABS 10.1*  --   HGB 11.8* 11.3*  HCT 36.0* 34.9*  MCV 94.0 94.8  PLT 303 264   Cardiac Enzymes: No results for input(s): CKTOTAL, CKMB, CKMBINDEX, TROPONINI in the last 168 hours. BNP (last 3 results) No results for input(s): PROBNP in the last 8760 hours. CBG: No results for input(s): GLUCAP in the last 168 hours.  Recent Results (from the past 240 hour(s))  Surgical pcr screen     Status: None   Collection Time: 04/05/14  6:34 PM  Result Value Ref Range Status   MRSA, PCR NEGATIVE NEGATIVE Final   Staphylococcus aureus NEGATIVE NEGATIVE Final    Comment:        The Xpert SA Assay (FDA approved for NASAL specimens in patients over 33 years of age), is one component of a comprehensive surveillance program.  Test performance has been validated by [facility] for patients greater than or equal to 71 year old. It is not intended to diagnose  infection nor to guide or monitor treatment.   Clostridium Difficile by PCR     Status: None   Collection Time: 04/06/14  5:20 AM  Result Value Ref Range Status   C difficile by pcr NEGATIVE NEGATIVE Final    Comment: Performed at [facility]     Studies: Chest Portable 1 View  04/05/2014   CLINICAL DATA:  Weakness. Right hip deformity with planned possible arthroplasty. Initial encounter.  EXAM: PORTABLE CHEST - 1 VIEW  COMPARISON:  None.  FINDINGS: 1840 hr. The heart size and  mediastinal contours are normal for age. There is mild aortic tortuosity. The lungs are clear. There is no pleural effusion or pneumothorax. No acute osseous findings are evident. There are mild glenohumeral and lower cervical spine degenerative changes.  IMPRESSION: No active cardiopulmonary process or acute osseous findings.   Electronically Signed   By: Camie Patience M.D.   On: 04/05/2014 18:51   Dg Hip Unilat With Pelvis 2-3 Views Right  04/05/2014   CLINICAL DATA:  Right hip pain for 1 year following a previous injury, recent fall with increasing hip pain  EXAM: DG HIP W/ PELVIS 2-3V*R*  COMPARISON:  None.  FINDINGS: The pelvic ring is intact. There are chronic changes involving the right proximal femur with prior fracture and breakdown of the femoral head and femoral neck. The femur is displaced superiorly along the superior lateral aspect of the acetabulum. Some dystrophic calcifications are seen. No gross soft tissue abnormality is noted.  IMPRESSION: Changes consistent with prior fracture with significant remodeling of the proximal femur and superior subluxation. No definitive acute fracture is identified on this exam.   Electronically Signed   By: Inez Catalina M.D.   On: 04/05/2014 14:08    Scheduled Meds: . vitamin B-12  500 mcg Oral Daily  . ferrous sulfate  325 mg Oral TID PC  . furosemide  20 mg Oral Daily  . Influenza vac split quadrivalent PF  0.5 mL Intramuscular Tomorrow-1000  . multivitamin  1 tablet Oral Daily  . omega-3 acid ethyl esters  1 g Oral Daily   Continuous Infusions: . sodium chloride 50 mL/hr (04/06/14 0932)    Active Problems:   Osteomyelitis of toe of left foot   Hip pain   Generalized weakness   Failure to thrive in adult   Anemia   Right hip pain    Time spent: >35 minutes     Kinnie Feil  [facility] Pager [phone]. If 7PM-7AM, please contact night-coverage at [email or web address], password [facility] 04/06/2014, 11:29 AM  LOS: 1 day

## 2014-04-06 NOTE — Progress Notes (Signed)
Clinical Social Work Department BRIEF PSYCHOSOCIAL ASSESSMENT 04/06/2014  Patient:  [name], [name]     Account Number:  [id number]     Admit date:  04/05/2014  Clinical Social Worker:  Lacie Scotts  Date/Time:  04/06/2014 01:01 PM  Referred by:  Physician  Date Referred:  04/05/2014 Referred for  SNF Placement   Other Referral:   Interview type:  Other - See comment Other interview type:   chart reviewed / feedback from [name] Status:  ALONE Admitted from facility:   Level of care:   Primary support name:  [name] Primary support relationship to patient:  CHILD, ADULT Degree of support available:   supportive but [place] out of state    CURRENT CONCERNS Current Concerns  Post-Acute Placement   Other Concerns:    SOCIAL WORK ASSESSMENT / PLAN Pt is an 81 yr old gentleman living at home prior to hospitalization. CSW, [name], met with pt / son in ER 1/24  to assist with d/c planning needs. Pt has since been admitted and trans to [place]. Pt has been seen by ortho and hip surgery is pending. Pt had plans for hip surgery in FEB. Following surgery pt plans to have [facility] Rehab at [facility]. SNF has been contacted and d/c plan confirmed.   Assessment/plan status:  Psychosocial Support/Ongoing Assessment of Needs Other assessment/ plan:   Information/referral to community resources:   insurance coverage for SNF reviewed by ER CSW    PATIENT'S/FAMILY'S RESPONSE TO PLAN OF CARE: Pt / son are relieved surgery will be done prior to [facility], as previously scheduled. Son was pleased with his tour of [place].    [name] LCSW [phone]

## 2014-04-06 NOTE — Progress Notes (Signed)
Patient ID: [name], male   DOB: [birth year], 80 y.o.   MRN: [id number]  81yo patient of mine admitted due to "failure to thrive" scenario with regards to ability to function related to his right hip arthritis He was on my OR schedule for February   Based inability to function and concerns about falling related to hi hip pain he was admitted  Comfortable in bed at this time  Due to opening in OR schedule tomorrow I am actually able to get him on the schedule tomorrow to replace his right hip Orders placed He had apparently been to his pre-op appointment already on 1/24 His staph screening was negative - no mupiricin required  Consent previously ordered NPO after midnight for surgery tomorrow

## 2014-04-06 NOTE — Progress Notes (Signed)
OT Cancellation Note  Patient Details Name: [name] MRN: [id number] DOB: [birth year]   Cancelled Treatment:    Reason Eval/Treat Not Completed: Other (comment)  Spoke with RN.  Pt having surgery tomorrow with Dr. Alvan Dame. Will address ADL activity with OT evaluation post surgery.     Betsy Pries 04/06/2014, 11:06 AM

## 2014-04-06 NOTE — Progress Notes (Signed)
Clinical Social Work Department CLINICAL SOCIAL WORK PLACEMENT NOTE 04/06/2014  Patient:  [name], [name]  Account Number:  [id number] Admit date:  04/05/2014  Clinical Social Worker:  [name], LCSW  Date/time:  04/06/2014 01:12 PM  Clinical Social Work is seeking post-discharge placement for this patient at the following level of care:   SKILLED NURSING   (*CSW will update this form in Epic as items are completed)     Patient/family provided with [facility] Department of Clinical Social Work's list of facilities offering this level of care within the geographic area requested by the patient (or if unable, by the patient's family).  04/06/2014  Patient/family informed of their freedom to choose among providers that offer the needed level of care, that participate in Medicare, Medicaid or managed care program needed by the patient, have an available bed and are willing to accept the patient.    Patient/family informed of [facility]' ownership interest in [facility], as well as of the fact that they are under no obligation to receive care at this facility.  PASARR submitted to EDS on 04/05/2014 PASARR number received on 04/05/2014  FL2 transmitted to all facilities in geographic area requested by pt/family on  04/06/2014 FL2 transmitted to all facilities within larger geographic area on   Patient informed that his/her managed care company has contracts with or will negotiate with  certain facilities, including the following:     Patient/family informed of bed offers received:  04/06/2014 Patient chooses bed at [facility] Physician recommends and patient chooses bed at    Patient to be transferred to  on   Patient to be transferred to facility by  Patient and family notified of transfer on  Name of family member notified:    The following physician request were entered in Epic:   Additional Comments:  [name] LCSW [phone]

## 2014-04-06 NOTE — Progress Notes (Addendum)
PT Cancellation Note  Patient Details Name: [name] MRN: [id number] DOB: 07-Jun-1924   Cancelled Treatment:    Reason Eval/Treat Not Completed: Pt now scheduled for surgery on 04/07/13. will hold PT eval and await new orders from ortho MD. Thanks.    Weston Anna, MPT Pager: [phone]

## 2014-04-06 NOTE — Progress Notes (Signed)
INITIAL NUTRITION ASSESSMENT  DOCUMENTATION CODES Per approved criteria  -Not Applicable   INTERVENTION: - Ensure Complete po BID, each supplement provides 350 kcal and 13 grams of protein - RD will continue to monitor for nutrition care plan.   NUTRITION DIAGNOSIS: Increased nutrient needs related to protein and calories as evidenced by hip fracture repair.   Goal: Pt to meet >/= 90% of their estimated nutrition needs   Monitor:  Weight trend, po intake, acceptance of supplements, labs  Reason for Assessment: Consult for nutrition assessment  81 y.o. male  Admitting Dx: <principal problem not specified>  ASSESSMENT: 81 y.o. Caucasian male with history of osteomyelitis of his left toe status post amputation complicated by MRSA infection, and osteoporosis who presents with right hip pain.  - Pt reports that his appetite has been poor for three days PTA. He reports that his appetite has improved since admission. He is eating 100% of his meals. Pt reports a 5 lb wt loss. Per chart history, pt's weight has been stable.  - Pt scheduled for hip replacement surgery tomorrow.  - Pt with mild to moderate wasting of the arms.   Labs Reviewed  Height: Ht Readings from Last 1 Encounters:  04/05/14 5\' 10"  (1.778 m)    Weight: Wt Readings from Last 1 Encounters:  04/05/14 171 lb 9.6 oz (77.837 kg)    Ideal Body Weight: 73 kg  % Ideal Body Weight: 107%  Wt Readings from Last 10 Encounters:  04/05/14 171 lb 9.6 oz (77.837 kg)  03/23/14 171 lb 9.6 oz (77.837 kg)  03/17/14 165 lb (74.844 kg)  04/22/13 183 lb (83.008 kg)  10/12/11 169 lb (76.658 kg)  09/21/11 166 lb (75.297 kg)  08/24/11 165 lb 4 oz (74.957 kg)  08/08/11 162 lb (73.483 kg)  07/13/11 159 lb (72.122 kg)  06/22/11 159 lb (72.122 kg)    BMI:  Body mass index is 24.62 kg/(m^2).  Estimated Nutritional Needs: Kcal: 5176-1607 Protein: 110-120 g Fluid: 2.0-2.3 L/day  Skin: wound on left knee  Diet Order:  Diet regular Diet NPO time specified  EDUCATION NEEDS: -Education needs addressed   Intake/Output Summary (Last 24 hours) at 04/06/14 1617 Last data filed at 04/06/14 1434  Gross per 24 hour  Intake   1530 ml  Output    850 ml  Net    680 ml    Last BM: 1/25   Labs:   Recent Labs Lab 04/05/14 1329 04/06/14 0505  NA 137 140  K 3.8 4.6  CL 103 105  CO2 26 27  BUN 17 16  CREATININE 0.93 0.85  CALCIUM 8.3* 8.1*  GLUCOSE 92 88    CBG (last 3)  No results for input(s): GLUCAP in the last 72 hours.  Scheduled Meds: . vitamin B-12  500 mcg Oral Daily  . ferrous sulfate  325 mg Oral TID PC  . Influenza vac split quadrivalent PF  0.5 mL Intramuscular Tomorrow-1000  . multivitamin  1 tablet Oral Daily  . omega-3 acid ethyl esters  1 g Oral Daily    Continuous Infusions: . sodium chloride 50 mL/hr (04/06/14 0932)    Past Medical History  Diagnosis Date  . Pneumonia   . Osteomyelitis of toe of left foot 06/07/2011  . MRSA (methicillin resistant staph aureus) culture positive 06/07/2011  . Osteoporosis   . Coronary artery disease   . Heart murmur     Past Surgical History  Procedure Laterality Date  . Left  rotator cuff repair    .  Tonsillectomy    . Amputation  06/05/2011    Procedure: AMPUTATION DIGIT;  Surgeon: Wylene Simmer, MD;  Location: [facility];  Service: Orthopedics;  Laterality: Left;  Left hallux amputation    Laurette Schimke MS, RD, LDN

## 2014-04-07 ENCOUNTER — Encounter (HOSPITAL_COMMUNITY)
Admission: EM | Disposition: A | Discharge status: Skilled Nursing Facility | Payer: Self-pay | Source: Home / Self Care | Attending: Internal Medicine

## 2014-04-07 ENCOUNTER — Inpatient Hospital Stay (HOSPITAL_COMMUNITY): Payer: Medicare Other | Admitting: Certified Registered Nurse Anesthetist

## 2014-04-07 ENCOUNTER — Inpatient Hospital Stay (HOSPITAL_COMMUNITY): Payer: Medicare Other

## 2014-04-07 ENCOUNTER — Encounter (HOSPITAL_COMMUNITY): Payer: Self-pay | Admitting: Anesthesiology

## 2014-04-07 DIAGNOSIS — Z96649 Presence of unspecified artificial hip joint: Secondary | ICD-10-CM

## 2014-04-07 HISTORY — PX: TOTAL HIP ARTHROPLASTY: SHX124

## 2014-04-07 LAB — APTT: APTT: 40 s — AB (ref 24–37)

## 2014-04-07 LAB — TYPE AND SCREEN
ABO/RH(D): A POS
ANTIBODY SCREEN: NEGATIVE

## 2014-04-07 LAB — PROTIME-INR
INR: 1.2 (ref 0.00–1.49)
Prothrombin Time: 15.3 seconds — ABNORMAL HIGH (ref 11.6–15.2)

## 2014-04-07 LAB — ABO/RH: ABO/RH(D): A POS

## 2014-04-07 SURGERY — ARTHROPLASTY, HIP, TOTAL, ANTERIOR APPROACH
Anesthesia: General | Laterality: Right

## 2014-04-07 MED ORDER — HYDROMORPHONE HCL 1 MG/ML IJ SOLN
INTRAMUSCULAR | Status: AC
  Filled 2014-04-07: qty 1

## 2014-04-07 MED ORDER — LIDOCAINE HCL (CARDIAC) 20 MG/ML IV SOLN
INTRAVENOUS | Status: AC
  Filled 2014-04-07: qty 5

## 2014-04-07 MED ORDER — SODIUM CHLORIDE 0.9 % IJ SOLN
INTRAMUSCULAR | Status: AC
  Filled 2014-04-07: qty 10

## 2014-04-07 MED ORDER — PHENYLEPHRINE 40 MCG/ML (10ML) SYRINGE FOR IV PUSH (FOR BLOOD PRESSURE SUPPORT)
PREFILLED_SYRINGE | INTRAVENOUS | Status: AC
  Filled 2014-04-07: qty 10

## 2014-04-07 MED ORDER — ONDANSETRON HCL 4 MG/2ML IJ SOLN: 4.0000 mg | Freq: Four times a day (QID) | INTRAMUSCULAR | Status: DC | PRN

## 2014-04-07 MED ORDER — ONDANSETRON HCL 4 MG PO TABS: 4.0000 mg | ORAL_TABLET | Freq: Four times a day (QID) | ORAL | Status: DC | PRN

## 2014-04-07 MED ORDER — POTASSIUM CHLORIDE 2 MEQ/ML IV SOLN
100.0000 mL/h | INTRAVENOUS | Status: DC
  Administered 2014-04-07 (×2): 100 mL/h via INTRAVENOUS
  Filled 2014-04-07 (×9): qty 1000

## 2014-04-07 MED ORDER — FENTANYL CITRATE 0.05 MG/ML IJ SOLN
INTRAMUSCULAR | Status: AC
  Filled 2014-04-07: qty 2

## 2014-04-07 MED ORDER — SODIUM CHLORIDE 0.9 % IR SOLN
Status: DC | PRN
  Administered 2014-04-07: 1000 mL

## 2014-04-07 MED ORDER — LIDOCAINE HCL (CARDIAC) 20 MG/ML IV SOLN
INTRAVENOUS | Status: DC | PRN
  Administered 2014-04-07: 50 mg via INTRAVENOUS

## 2014-04-07 MED ORDER — FENTANYL CITRATE 0.05 MG/ML IJ SOLN
25.0000 ug | INTRAMUSCULAR | Status: DC | PRN
  Administered 2014-04-07 (×2): 50 ug via INTRAVENOUS

## 2014-04-07 MED ORDER — PHENYLEPHRINE HCL 10 MG/ML IJ SOLN
INTRAMUSCULAR | Status: DC | PRN
  Administered 2014-04-07 (×2): 80 ug via INTRAVENOUS

## 2014-04-07 MED ORDER — ALUM & MAG HYDROXIDE-SIMETH 200-200-20 MG/5ML PO SUSP: 30.0000 mL | ORAL | Status: DC | PRN

## 2014-04-07 MED ORDER — ACETAMINOPHEN 325 MG PO TABS: 325.0000 mg | ORAL_TABLET | ORAL | Status: DC | PRN

## 2014-04-07 MED ORDER — ACETAMINOPHEN 160 MG/5ML PO SOLN
325.0000 mg | ORAL | Status: DC | PRN
  Filled 2014-04-07: qty 20.3

## 2014-04-07 MED ORDER — NEOSTIGMINE METHYLSULFATE 10 MG/10ML IV SOLN
INTRAVENOUS | Status: AC
  Filled 2014-04-07: qty 1

## 2014-04-07 MED ORDER — METOCLOPRAMIDE HCL 10 MG PO TABS
5.0000 mg | ORAL_TABLET | Freq: Three times a day (TID) | ORAL | Status: DC | PRN
Start: 2014-04-07 — End: 2014-04-09

## 2014-04-07 MED ORDER — CEFAZOLIN SODIUM-DEXTROSE 2-3 GM-% IV SOLR
2.0000 g | INTRAVENOUS | Status: AC
  Administered 2014-04-07: 2 g via INTRAVENOUS

## 2014-04-07 MED ORDER — OXYCODONE HCL 5 MG PO TABS: 5.0000 mg | ORAL_TABLET | Freq: Once | ORAL | Status: DC | PRN

## 2014-04-07 MED ORDER — MENTHOL 3 MG MT LOZG
1.0000 | LOZENGE | OROMUCOSAL | Status: DC | PRN
  Filled 2014-04-07: qty 9

## 2014-04-07 MED ORDER — ROCURONIUM BROMIDE 100 MG/10ML IV SOLN
INTRAVENOUS | Status: AC
  Filled 2014-04-07: qty 1

## 2014-04-07 MED ORDER — METHOCARBAMOL 1000 MG/10ML IJ SOLN
500.0000 mg | Freq: Four times a day (QID) | INTRAVENOUS | Status: DC | PRN
  Administered 2014-04-07: 500 mg via INTRAVENOUS
  Filled 2014-04-07 (×2): qty 5

## 2014-04-07 MED ORDER — OXYCODONE HCL 5 MG/5ML PO SOLN
5.0000 mg | Freq: Once | ORAL | Status: DC | PRN
  Filled 2014-04-07: qty 5

## 2014-04-07 MED ORDER — SUCCINYLCHOLINE CHLORIDE 20 MG/ML IJ SOLN
INTRAMUSCULAR | Status: DC | PRN
Start: 2014-04-07 — End: 2014-04-07
  Administered 2014-04-07: 50 mg via INTRAVENOUS

## 2014-04-07 MED ORDER — MIDAZOLAM HCL 2 MG/2ML IJ SOLN
INTRAMUSCULAR | Status: AC
  Filled 2014-04-07: qty 2

## 2014-04-07 MED ORDER — FENTANYL CITRATE 0.05 MG/ML IJ SOLN
INTRAMUSCULAR | Status: DC | PRN
  Administered 2014-04-07 (×2): 25 ug via INTRAVENOUS
  Administered 2014-04-07: 50 ug via INTRAVENOUS

## 2014-04-07 MED ORDER — CEFAZOLIN SODIUM-DEXTROSE 2-3 GM-% IV SOLR
2.0000 g | Freq: Four times a day (QID) | INTRAVENOUS | Status: AC
  Administered 2014-04-07 – 2014-04-08 (×2): 2 g via INTRAVENOUS
  Filled 2014-04-07 (×2): qty 50

## 2014-04-07 MED ORDER — CEFAZOLIN SODIUM-DEXTROSE 2-3 GM-% IV SOLR
INTRAVENOUS | Status: AC
  Filled 2014-04-07: qty 50

## 2014-04-07 MED ORDER — GLYCOPYRROLATE 0.2 MG/ML IJ SOLN
INTRAMUSCULAR | Status: AC
  Filled 2014-04-07: qty 3

## 2014-04-07 MED ORDER — PROPOFOL 10 MG/ML IV BOLUS
INTRAVENOUS | Status: DC | PRN
  Administered 2014-04-07: 100 mg via INTRAVENOUS

## 2014-04-07 MED ORDER — EPHEDRINE SULFATE 50 MG/ML IJ SOLN
INTRAMUSCULAR | Status: AC
  Filled 2014-04-07: qty 1

## 2014-04-07 MED ORDER — MAGNESIUM CITRATE PO SOLN: 1.0000 | Freq: Once | ORAL | Status: AC | PRN

## 2014-04-07 MED ORDER — DIPHENHYDRAMINE HCL 25 MG PO CAPS: 25.0000 mg | ORAL_CAPSULE | Freq: Four times a day (QID) | ORAL | Status: DC | PRN

## 2014-04-07 MED ORDER — BISACODYL 10 MG RE SUPP: 10.0000 mg | Freq: Every day | RECTAL | Status: DC | PRN

## 2014-04-07 MED ORDER — METOCLOPRAMIDE HCL 5 MG/ML IJ SOLN: 5.0000 mg | Freq: Three times a day (TID) | INTRAMUSCULAR | Status: DC | PRN

## 2014-04-07 MED ORDER — DEXAMETHASONE SODIUM PHOSPHATE 10 MG/ML IJ SOLN
10.0000 mg | Freq: Once | INTRAMUSCULAR | Status: AC
  Administered 2014-04-08: 10 mg via INTRAVENOUS
  Filled 2014-04-07: qty 1

## 2014-04-07 MED ORDER — ONDANSETRON HCL 4 MG/2ML IJ SOLN
INTRAMUSCULAR | Status: DC | PRN
  Administered 2014-04-07: 4 mg via INTRAVENOUS

## 2014-04-07 MED ORDER — DEXAMETHASONE SODIUM PHOSPHATE 10 MG/ML IJ SOLN
INTRAMUSCULAR | Status: AC
  Filled 2014-04-07: qty 1

## 2014-04-07 MED ORDER — CHLORHEXIDINE GLUCONATE 4 % EX LIQD
60.0000 mL | Freq: Once | CUTANEOUS | Status: AC
  Administered 2014-04-07: 4 via TOPICAL
  Filled 2014-04-07: qty 60

## 2014-04-07 MED ORDER — ASPIRIN EC 325 MG PO TBEC
325.0000 mg | DELAYED_RELEASE_TABLET | Freq: Two times a day (BID) | ORAL | Status: DC
  Administered 2014-04-08 – 2014-04-09 (×3): 325 mg via ORAL
  Filled 2014-04-07 (×5): qty 1

## 2014-04-07 MED ORDER — ROCURONIUM BROMIDE 100 MG/10ML IV SOLN
INTRAVENOUS | Status: DC | PRN
  Administered 2014-04-07: 20 mg via INTRAVENOUS

## 2014-04-07 MED ORDER — CELECOXIB 200 MG PO CAPS
200.0000 mg | ORAL_CAPSULE | Freq: Two times a day (BID) | ORAL | Status: DC
  Administered 2014-04-07 – 2014-04-09 (×4): 200 mg via ORAL
  Filled 2014-04-07 (×6): qty 1

## 2014-04-07 MED ORDER — HYDROMORPHONE HCL 1 MG/ML IJ SOLN
0.2500 mg | INTRAMUSCULAR | Status: DC | PRN
  Administered 2014-04-07: 0.5 mg via INTRAVENOUS

## 2014-04-07 MED ORDER — PHENOL 1.4 % MT LIQD
1.0000 | OROMUCOSAL | Status: DC | PRN
  Filled 2014-04-07: qty 177

## 2014-04-07 MED ORDER — DOCUSATE SODIUM 100 MG PO CAPS
100.0000 mg | ORAL_CAPSULE | Freq: Two times a day (BID) | ORAL | Status: DC
  Administered 2014-04-08 – 2014-04-09 (×3): 100 mg via ORAL

## 2014-04-07 MED ORDER — PROPOFOL 10 MG/ML IV BOLUS
INTRAVENOUS | Status: AC
  Filled 2014-04-07: qty 20

## 2014-04-07 MED ORDER — DEXAMETHASONE SODIUM PHOSPHATE 10 MG/ML IJ SOLN
INTRAMUSCULAR | Status: DC | PRN
  Administered 2014-04-07: 10 mg via INTRAVENOUS

## 2014-04-07 MED ORDER — METHOCARBAMOL 500 MG PO TABS: 500.0000 mg | ORAL_TABLET | Freq: Four times a day (QID) | ORAL | Status: DC | PRN

## 2014-04-07 MED ORDER — ONDANSETRON HCL 4 MG/2ML IJ SOLN
INTRAMUSCULAR | Status: AC
  Filled 2014-04-07: qty 2

## 2014-04-07 MED ORDER — HYDROCODONE-ACETAMINOPHEN 7.5-325 MG PO TABS
1.0000 | ORAL_TABLET | ORAL | Status: DC
  Administered 2014-04-07 – 2014-04-08 (×3): 2 via ORAL
  Administered 2014-04-08 (×3): 1 via ORAL
  Administered 2014-04-08: 2 via ORAL
  Administered 2014-04-09 (×3): 1 via ORAL
  Filled 2014-04-07 (×2): qty 1
  Filled 2014-04-07 (×4): qty 2
  Filled 2014-04-07 (×4): qty 1

## 2014-04-07 MED ORDER — POLYETHYLENE GLYCOL 3350 17 G PO PACK
17.0000 g | PACK | Freq: Two times a day (BID) | ORAL | Status: DC
  Administered 2014-04-08 – 2014-04-09 (×2): 17 g via ORAL

## 2014-04-07 MED ORDER — EPHEDRINE SULFATE 50 MG/ML IJ SOLN
INTRAMUSCULAR | Status: DC | PRN
  Administered 2014-04-07 (×3): 10 mg via INTRAVENOUS

## 2014-04-07 MED ORDER — HYDROMORPHONE HCL 1 MG/ML IJ SOLN: 0.5000 mg | INTRAMUSCULAR | Status: DC | PRN

## 2014-04-07 MED ORDER — LACTATED RINGERS IV SOLN
INTRAVENOUS | Status: DC
  Administered 2014-04-07: 15:00:00 via INTRAVENOUS
  Administered 2014-04-07: 1000 mL via INTRAVENOUS

## 2014-04-07 MED ORDER — CHLORHEXIDINE GLUCONATE 4 % EX LIQD
60.0000 mL | Freq: Once | CUTANEOUS | Status: DC
  Filled 2014-04-07: qty 60

## 2014-04-07 SURGICAL SUPPLY — 43 items
ADH SKN CLS APL DERMABOND .7 (GAUZE/BANDAGES/DRESSINGS) ×1
BAG SPEC THK2 15X12 ZIP CLS (MISCELLANEOUS)
BAG ZIPLOCK 12X15 (MISCELLANEOUS) IMPLANT
CAPT HIP TOTAL 2 ×3 IMPLANT
COVER PERINEAL POST (MISCELLANEOUS) ×3 IMPLANT
DERMABOND ADVANCED (GAUZE/BANDAGES/DRESSINGS) ×2
DERMABOND ADVANCED .7 DNX12 (GAUZE/BANDAGES/DRESSINGS) IMPLANT
DRAPE C-ARM 42X120 X-RAY (DRAPES) ×3 IMPLANT
DRAPE STERI IOBAN 125X83 (DRAPES) ×3 IMPLANT
DRAPE U-SHAPE 47X51 STRL (DRAPES) ×9 IMPLANT
DRSG AQUACEL AG ADV 3.5X10 (GAUZE/BANDAGES/DRESSINGS) ×3 IMPLANT
DURAPREP 26ML APPLICATOR (WOUND CARE) ×3 IMPLANT
ELECT BLADE TIP CTD 4 INCH (ELECTRODE) ×3 IMPLANT
ELECT PENCIL ROCKER SW 15FT (MISCELLANEOUS) ×2 IMPLANT
ELECT REM PT RETURN 15FT ADLT (MISCELLANEOUS) ×3 IMPLANT
ELECT REM PT RETURN 9FT ADLT (ELECTROSURGICAL) ×3
ELECTRODE REM PT RTRN 9FT ADLT (ELECTROSURGICAL) ×1 IMPLANT
FACESHIELD WRAPAROUND (MASK) ×12 IMPLANT
FACESHIELD WRAPAROUND OR TEAM (MASK) ×4 IMPLANT
GLOVE BIOGEL PI IND STRL 7.5 (GLOVE) ×1 IMPLANT
GLOVE BIOGEL PI IND STRL 8.5 (GLOVE) ×1 IMPLANT
GLOVE BIOGEL PI INDICATOR 7.5 (GLOVE) ×2
GLOVE BIOGEL PI INDICATOR 8.5 (GLOVE) ×2
GLOVE ECLIPSE 8.0 STRL XLNG CF (GLOVE) ×6 IMPLANT
GLOVE ORTHO TXT STRL SZ7.5 (GLOVE) ×3 IMPLANT
GOWN SPEC L3 XXLG W/TWL (GOWN DISPOSABLE) ×3 IMPLANT
GOWN STRL REUS W/TWL LRG LVL3 (GOWN DISPOSABLE) ×3 IMPLANT
HOLDER FOLEY CATH W/STRAP (MISCELLANEOUS) ×3 IMPLANT
KIT BASIN OR (CUSTOM PROCEDURE TRAY) ×3 IMPLANT
LIQUID BAND (GAUZE/BANDAGES/DRESSINGS) ×3 IMPLANT
PACK TOTAL JOINT (CUSTOM PROCEDURE TRAY) ×3 IMPLANT
SAW OSC TIP CART 19.5X105X1.3 (SAW) ×3 IMPLANT
SPONGE LAP 18X18 X RAY DECT (DISPOSABLE) ×2 IMPLANT
SUT MNCRL AB 4-0 PS2 18 (SUTURE) ×3 IMPLANT
SUT VIC AB 1 CT1 36 (SUTURE) ×9 IMPLANT
SUT VIC AB 2-0 CT1 27 (SUTURE) ×6
SUT VIC AB 2-0 CT1 TAPERPNT 27 (SUTURE) ×2 IMPLANT
SUT VLOC 180 0 24IN GS25 (SUTURE) ×3 IMPLANT
SYR 50ML LL SCALE MARK (SYRINGE) ×3 IMPLANT
TOWEL OR 17X26 10 PK STRL BLUE (TOWEL DISPOSABLE) ×3 IMPLANT
TOWEL OR NON WOVEN STRL DISP B (DISPOSABLE) IMPLANT
TRAY FOLEY CATH 16FRSI W/METER (SET/KITS/TRAYS/PACK) ×2 IMPLANT
WATER STERILE IRR 1500ML POUR (IV SOLUTION) ×3 IMPLANT

## 2014-04-07 NOTE — Progress Notes (Signed)
OT Cancellation Note  Patient Details Name: KIEV LABROSSE MRN: [id number] DOB: 1924/04/30   Cancelled Treatment:     Please reorder OT after surgery.  Thank you.  Briggett Tuccillo 04/07/2014, 7:33 AM  Lesle Chris, OTR/L [phone] 04/07/2014

## 2014-04-07 NOTE — Progress Notes (Signed)
[facility] PROGRESS NOTE  FELDER LEBEDA QAS:[id number] DOB: 1924-09-04 DOA: 04/05/2014 PCP: Jerlyn Ly, MD  Assessment/Plan: 81 y/o male with PMH of DJD, h/o L toe osteomyelitis, MRSA, osteoporosis presented with R hip pain, inability to ambulate;    Right hip pain suspected due to osteoarthritis. Right hip x-ray shows finding consistent with prior fracture with significant remodeling of proximal femur and superior subluxation.  -per ortho surgery 1/26 -patient denies any cardiopulmonary symptoms; hold diuretic periop; Of note, patient was recently evaluated by cardiology Dr. Percival Spanish on 03/23/2014 patient was deemed "based on ACC/AHA guidelines, the patient would be at acceptable risk for the planned procedure without further cardiovascular testing."   Generalized weakness Due to the above. Continue PT/OT. Failure to thrive Due to above. Requested dietary consultation for assessing patient's nutritional status.  Anemia Check anemia panel in the morning. Started patient on supplemental iron.  Osteoporosis Resume bisphosphonates and calcium after discharge.  Code Status: full Family Communication: d/w patient, his son  Disposition Plan:    Consultants:  ortho  Procedures:  Pend surgery   Antibiotics:  none (indicate start date, and stop date if known)  HPI/Subjective: No overnight events  Objective: Filed Vitals:   04/07/14 0800  BP:   Pulse:   Temp:   Resp: 16    Intake/Output Summary (Last 24 hours) at 04/07/14 1017 Last data filed at 04/07/14 1002  Gross per 24 hour  Intake    240 ml  Output   1160 ml  Net   -920 ml   Filed Weights   04/05/14 1851  Weight: 77.837 kg (171 lb 9.6 oz)    Exam:   General:  alert  Cardiovascular: s1,s2 rrr  Respiratory: CTA BL  Abdomen: soft, nt,nd   Musculoskeletal: no LE edema   Data Reviewed: Basic Metabolic Panel:  Recent Labs Lab 04/05/14 1329 04/06/14 0505  NA 137 140  K 3.8 4.6  CL 103  105  CO2 26 27  GLUCOSE 92 88  BUN 17 16  CREATININE 0.93 0.85  CALCIUM 8.3* 8.1*   Liver Function Tests: No results for input(s): AST, ALT, ALKPHOS, BILITOT, PROT, ALBUMIN in the last 168 hours. No results for input(s): LIPASE, AMYLASE in the last 168 hours. No results for input(s): AMMONIA in the last 168 hours. CBC:  Recent Labs Lab 04/05/14 1329 04/06/14 0505  WBC 12.0* 9.6  NEUTROABS 10.1*  --   HGB 11.8* 11.3*  HCT 36.0* 34.9*  MCV 94.0 94.8  PLT 303 264   Cardiac Enzymes: No results for input(s): CKTOTAL, CKMB, CKMBINDEX, TROPONINI in the last 168 hours. BNP (last 3 results) No results for input(s): PROBNP in the last 8760 hours. CBG: No results for input(s): GLUCAP in the last 168 hours.  Recent Results (from the past 240 hour(s))  Surgical pcr screen     Status: None   Collection Time: 04/05/14  6:34 PM  Result Value Ref Range Status   MRSA, PCR NEGATIVE NEGATIVE Final   Staphylococcus aureus NEGATIVE NEGATIVE Final    Comment:        The Xpert SA Assay (FDA approved for NASAL specimens in patients over 70 years of age), is one component of a comprehensive surveillance program.  Test performance has been validated by [facility] for patients greater than or equal to 48 year old. It is not intended to diagnose infection nor to guide or monitor treatment.   Clostridium Difficile by PCR     Status: None   Collection  Time: 04/06/14  5:20 AM  Result Value Ref Range Status   C difficile by pcr NEGATIVE NEGATIVE Final    Comment: Performed at [facility]     Studies: Chest Portable 1 View  04/05/2014   CLINICAL DATA:  Weakness. Right hip deformity with planned possible arthroplasty. Initial encounter.  EXAM: PORTABLE CHEST - 1 VIEW  COMPARISON:  None.  FINDINGS: 1840 hr. The heart size and mediastinal contours are normal for age. There is mild aortic tortuosity. The lungs are clear. There is no pleural effusion or pneumothorax. No acute osseous  findings are evident. There are mild glenohumeral and lower cervical spine degenerative changes.  IMPRESSION: No active cardiopulmonary process or acute osseous findings.   Electronically Signed   By: Camie Patience M.D.   On: 04/05/2014 18:51   Dg Hip Unilat With Pelvis 2-3 Views Right  04/05/2014   CLINICAL DATA:  Right hip pain for 1 year following a previous injury, recent fall with increasing hip pain  EXAM: DG HIP W/ PELVIS 2-3V*R*  COMPARISON:  None.  FINDINGS: The pelvic ring is intact. There are chronic changes involving the right proximal femur with prior fracture and breakdown of the femoral head and femoral neck. The femur is displaced superiorly along the superior lateral aspect of the acetabulum. Some dystrophic calcifications are seen. No gross soft tissue abnormality is noted.  IMPRESSION: Changes consistent with prior fracture with significant remodeling of the proximal femur and superior subluxation. No definitive acute fracture is identified on this exam.   Electronically Signed   By: Inez Catalina M.D.   On: 04/05/2014 14:08    Scheduled Meds: .  ceFAZolin (ANCEF) IV  2 g Intravenous On Call to OR  . chlorhexidine  60 mL Topical Once  . vitamin B-12  500 mcg Oral Daily  . feeding supplement (ENSURE COMPLETE)  237 mL Oral BID BM  . ferrous sulfate  325 mg Oral TID PC  . Influenza vac split quadrivalent PF  0.5 mL Intramuscular Tomorrow-1000  . multivitamin  1 tablet Oral Daily  . omega-3 acid ethyl esters  1 g Oral Daily   Continuous Infusions:    Active Problems:   Osteomyelitis of toe of left foot   Hip pain   Generalized weakness   Failure to thrive in adult   Anemia   Right hip pain    Time spent: 25 minutes     Eliseo Squires JESSICA  [facility] Pager [phone] If 7PM-7AM, please contact night-coverage at [email or web address], password [facility] 04/07/2014, 10:17 AM  LOS: 2 days

## 2014-04-07 NOTE — Clinical Documentation Improvement (Signed)
Presents with pain in right hip likely secondary to Osteoarthritis.  Per ICD 10 Guidelines, please clarify the type of Osteoporosis the patient has:   Primary  Secondary  Erosive  Traumatic  Other  Please document findings in next progress note and include in discharge summary.  Thank You, Zoila Shutter ,RN Clinical Documentation Specialist:  [facility] [employer]

## 2014-04-07 NOTE — Progress Notes (Signed)
Patient ID: [name], male   DOB: 07/24/1924, 81 y.o.   MRN: [id number]  Definitely ready for OR today - pain significant  NPO Consent signed Labs reviewed  To OR this pm Planning on [place] post operatively for initial rehab

## 2014-04-07 NOTE — Op Note (Signed)
NAME:  GEE HABIG NO.: [id number]      MEDICAL RECORD NO.: [id number]      FACILITY:  [facility]      PHYSICIAN:  Paralee Cancel D  DATE OF BIRTH:  05-26-1924     DATE OF PROCEDURE:  04/07/2014                                 OPERATIVE REPORT         PREOPERATIVE DIAGNOSIS: Right  hip osteoarthritis.      POSTOPERATIVE DIAGNOSIS:  Right hip femoral head fracture/ osteoarthritis.      PROCEDURE:  Right total hip replacement through an anterior approach   utilizing DePuy THR system, component size 2mm pinnacle cup, a size 36+4 neutral   Altrex liner, a size 7 Hi Tri Lock stem with a 36+1.5 Articuleze metal head ball.      SURGEON:  Pietro Cassis. Alvan Dame, M.D.      ASSISTANT:  Danae Orleans, PA-C     ANESTHESIA:  General.      SPECIMENS:  None.      COMPLICATIONS:  None.      BLOOD LOSS:  600 cc     DRAINS:  None.      INDICATION OF THE PROCEDURE:  [name] is a 81 y.o. male who had   presented to office for evaluation of right hip pain.  Radiographs revealed   progressive degenerative changes with bone-on-bone   articulation to the  hip joint.  We have tried injections and other means to try and ease his pain.  He progressively had gotten worse.  He was eventually cleared for and scheduled for right total hip replacement in February.  However, due to progressively worsening pain he basically lost all ability to function at home.  He was admitted to the hospital Sunday due to the pain.  I was able to find a spot for him this week.  X-rays obtained upon admission revealed a significant change in his pre-operative X-rays in our office.  These demonstrated what appeared to be now a femoral head fracture with underlying arthritis of the right hip.  .  Consent was obtained for   benefit of pain relief.  Specific risk of infection, DVT, component   failure, dislocation, need for revision surgery, as well discussion of   the anterior  versus posterior approach were reviewed.  Consent was   obtained for benefit of anterior pain relief through an anterior   approach.      PROCEDURE IN DETAIL:  The patient was brought to operative theater.   Once adequate anesthesia, preoperative antibiotics, 2gm Ancef, 10mg  of Decadron administered.   The patient was positioned supine on the OSI [place] table.  Once adequate   padding of boney process was carried out, we had predraped out the hip, and  used fluoroscopy to confirm orientation of the pelvis and position.      The right hip was then prepped and draped from proximal iliac crest to   mid thigh with shower curtain technique.      Time-out was performed identifying the patient, planned procedure, and   extremity.     An incision was then made 2 cm distal and lateral to the   anterior superior iliac spine extending over the orientation of the  tensor fascia lata muscle and sharp dissection was carried down to the   fascia of the muscle and protractor placed in the soft tissues.      The fascia was then incised.  The muscle belly was identified and swept   laterally and retractor placed along the superior neck.  Following   cauterization of the circumflex vessels and removing some pericapsular   fat, a second cobra retractor was placed on the inferior neck.  A third   retractor was placed on the anterior acetabulum after elevating the   anterior rectus.  A capsulectomy was performed due to the traumatic inflammatory response recognized in the hip.  We then identified the trochanteric fossa and   orientation of my neck cut, confirmed this radiographically   and then made a neck osteotomy with the femur on traction.  The femoral   neck and head segment were removed without difficulty or complication.  Traction was let   off and retractors were placed posterior and anterior around the   acetabulum.      The labrum and foveal tissue were debrided.  The acetabulum had significant  wear change involving the posterior and superior walls.  I began reaming with a 47   reamer and reamed up to 53 reamer with good bony bed preparation maintaining as near an anatomic position as possible based off tear drop and confirmed radiographically.  A 67mm cup was chosen.  The final 58mm Pinnacle cup was then impacted under fluoroscopy  to confirm the depth of penetration and orientation with respect to   abduction.  A screw was placed followed by the hole eliminator.  The final   36+4 neutral Altrex liner was impacted with good visualized rim fit.  The cup was positioned anatomically within the acetabular portion of the pelvis.      At this point, the femur was rolled at 80 degrees.  Further capsule was   released off the inferior aspect of the femoral neck.  I then   released the superior capsule proximally.  The hook was placed laterally   along the femur and elevated manually and held in position with the bed   hook.  The leg was then extended and adducted with the leg rolled to 100   degrees of external rotation.  Once the proximal femur was fully   exposed, I used a box osteotome to set orientation.  I then began   broaching with the starting chili pepper broach and passed this by hand and then broached up to 7.  With the 7 broach in place I chose a high offset neck and did a trial reduction.  The offset was appropriate, leg lengths   appeared to be equal, confirmed radiographically.   Given these findings, I went ahead and dislocated the hip, repositioned all   retractors and positioned the right hip in the extended and abducted position.  The final 7 Hi Tri Lock stem was   chosen and it was impacted down to the level of neck cut.  Based on this   and the trial reduction, a 36+1.5 metal head ball was chosen and   impacted onto a clean and dry trunnion, and the hip was reduced.  The   hip had been irrigated throughout the case again at this point.  The fascia of the   tensor fascia  lata muscle was then reapproximated using #1 Vicryl and #0 V-lock sutures.  The   remaining wound was closed with 2-0  Vicryl and running 4-0 Monocryl.   The hip was cleaned, dried, and dressed sterilely using Dermabond and   Aquacel dressing.  He was then brought   to recovery room in stable condition tolerating the procedure well.    Danae Orleans, PA-C was present for the entirety of the case involved from   preoperative positioning, perioperative retractor management, general   facilitation of the case, as well as primary wound closure as assistant.            Pietro Cassis Alvan Dame, M.D.        04/07/2014 2:46 PM

## 2014-04-07 NOTE — Anesthesia Postprocedure Evaluation (Signed)
  Anesthesia Post-op Note  Patient: [name]  Procedure(s) Performed: Procedure(s): TOTAL HIP ARTHROPLASTY ANTERIOR APPROACH (Right)  Patient Location: PACU  Anesthesia Type:General  Level of Consciousness: awake  Airway and Oxygen Therapy: Patient Spontanous Breathing  Post-op Pain: mild  Post-op Assessment: Post-op Vital signs reviewed, Patient's Cardiovascular Status Stable, Respiratory Function Stable, Patent Airway, No signs of Nausea or vomiting and Pain level controlled  Post-op Vital Signs: Reviewed and stable  Last Vitals:  Filed Vitals:   04/07/14 2103  BP: 94/47  Pulse: 93  Temp: 36.7 C  Resp: 16    Complications: No apparent anesthesia complications

## 2014-04-07 NOTE — H&P (View-Only) (Signed)
Patient ID: [name], male   DOB: [birth year], 81 y.o.   MRN: [id number]  Definitely ready for OR today - pain significant  NPO Consent signed Labs reviewed  To OR this pm Planning on [place] post operatively for initial rehab

## 2014-04-07 NOTE — Anesthesia Preprocedure Evaluation (Signed)
Anesthesia Evaluation  Patient identified by MRN, date of birth, ID band Patient awake    Reviewed: Allergy & Precautions, NPO status , Patient's Chart, lab work & pertinent test results  History of Anesthesia Complications Negative for: history of anesthetic complications  Airway Mallampati: II  TM Distance: >3 FB Neck ROM: Full    Dental  (+) Edentulous Upper,    Pulmonary neg sleep apnea, neg COPDneg recent URI, former smoker,  breath sounds clear to auscultation        Cardiovascular - angina- Past MI, - CHF and - DOE Rhythm:Regular     Neuro/Psych negative neurological ROS  negative psych ROS   GI/Hepatic negative GI ROS, Neg liver ROS,   Endo/Other  negative endocrine ROS  Renal/GU negative Renal ROS     Musculoskeletal   Abdominal   Peds  Hematology  (+) anemia ,   Anesthesia Other Findings   Reproductive/Obstetrics                             Anesthesia Physical Anesthesia Plan  ASA: II  Anesthesia Plan: General   Post-op Pain Management:    Induction: Intravenous  Airway Management Planned: Oral ETT  Additional Equipment: None  Intra-op Plan:   Post-operative Plan: Extubation in OR  Informed Consent: I have reviewed the patients History and Physical, chart, labs and discussed the procedure including the risks, benefits and alternatives for the proposed anesthesia with the patient or authorized representative who has indicated his/her understanding and acceptance.   Dental advisory given  Plan Discussed with: CRNA and Surgeon  Anesthesia Plan Comments:         Anesthesia Quick Evaluation

## 2014-04-07 NOTE — Transfer of Care (Signed)
Immediate Anesthesia Transfer of Care Note  Patient: [name]  Procedure(s) Performed: Procedure(s): TOTAL HIP ARTHROPLASTY ANTERIOR APPROACH (Right)  Patient Location: PACU  Anesthesia Type:General  Level of Consciousness: awake, alert  and oriented  Airway & Oxygen Therapy: Patient Spontanous Breathing and Patient connected to face mask oxygen  Post-op Assessment: Report given to PACU RN and Post -op Vital signs reviewed and stable  Post vital signs: Reviewed and stable  Complications: No apparent anesthesia complications

## 2014-04-07 NOTE — Interval H&P Note (Signed)
History and Physical Interval Note:  04/07/2014 11:24 AM  [name]  has presented today for surgery, with the diagnosis of hip fracture  The various methods of treatment have been discussed with the patient and family. After consideration of risks, benefits and other options for treatment, the patient has consented to  Procedure(s): TOTAL HIP ARTHROPLASTY ANTERIOR APPROACH (Right) as a surgical intervention .  The patient's history has been reviewed, patient examined, no change in status, stable for surgery.  I have reviewed the patient's chart and labs.  Questions were answered to the patient's satisfaction.     Mauri Pole

## 2014-04-08 ENCOUNTER — Encounter (HOSPITAL_COMMUNITY): Payer: Self-pay | Admitting: Orthopedic Surgery

## 2014-04-08 DIAGNOSIS — D539 Nutritional anemia, unspecified: Secondary | ICD-10-CM

## 2014-04-08 DIAGNOSIS — Z9660 Presence of unspecified orthopedic joint implant: Secondary | ICD-10-CM

## 2014-04-08 LAB — CBC
HCT: 32 % — ABNORMAL LOW (ref 39.0–52.0)
HEMOGLOBIN: 10.3 g/dL — AB (ref 13.0–17.0)
MCH: 30.4 pg (ref 26.0–34.0)
MCHC: 32.2 g/dL (ref 30.0–36.0)
MCV: 94.4 fL (ref 78.0–100.0)
PLATELETS: 312 10*3/uL (ref 150–400)
RBC: 3.39 MIL/uL — ABNORMAL LOW (ref 4.22–5.81)
RDW: 14.8 % (ref 11.5–15.5)
WBC: 10 10*3/uL (ref 4.0–10.5)

## 2014-04-08 LAB — BASIC METABOLIC PANEL
Anion gap: 7 (ref 5–15)
BUN: 19 mg/dL (ref 6–23)
CALCIUM: 7.6 mg/dL — AB (ref 8.4–10.5)
CO2: 26 mmol/L (ref 19–32)
CREATININE: 0.74 mg/dL (ref 0.50–1.35)
Chloride: 102 mmol/L (ref 96–112)
GFR calc Af Amer: >90 mL/min (ref >90)
GFR calc non Af Amer: 80 mL/min — ABNORMAL LOW (ref >90)
Glucose, Bld: 156 mg/dL — ABNORMAL HIGH (ref 70–99)
POTASSIUM: 4.8 mmol/L (ref 3.5–5.1)
Sodium: 135 mmol/L (ref 135–145)

## 2014-04-08 NOTE — Progress Notes (Signed)
[facility] PROGRESS NOTE  [name] HQP:[id number] DOB: 04-01-1924 DOA: 04/05/2014 PCP: Jerlyn Ly, MD   HPI/Subjective: No overnight events  Assessment/Plan: 81 y/o male with PMH of DJD, h/o L toe osteomyelitis, MRSA, osteoporosis presented with R hip pain, inability to ambulate;    Right hip pain suspected due to osteoarthritis. Right hip x-ray shows finding consistent with prior fracture with significant remodeling of proximal femur and superior subluxation.  -s/p right total hip replacement on 1/26 - recovering well -patient denies any cardiopulmonary symptoms; hold diuretic periop; Of note, patient was recently evaluated by cardiology Dr. Percival Spanish on 03/23/2014 patient was deemed "based on ACC/AHA guidelines, the patient would be at acceptable risk for the planned procedure without further cardiovascular testing."   Generalized weakness Due to the above. Continue PT/OT. SNF placement.  Failure to thrive Due to above. Requested dietary consultation for assessing patient's nutritional status.  Anemia stable. Started patient on supplemental iron.  Primary Osteoporosis Resume bisphosphonates and calcium after discharge.  Code Status: full Family Communication: d/w patient Disposition Plan:    Consultants:  ortho  Procedures:  Total hip replacement 1/26  Objective: Filed Vitals:   04/08/14 1327  BP: 86/56  Pulse: 98  Temp:   Resp: 16    Intake/Output Summary (Last 24 hours) at 04/08/14 1409 Last data filed at 04/08/14 1041  Gross per 24 hour  Intake   2602 ml  Output   1225 ml  Net   1377 ml   Filed Weights   04/05/14 1851  Weight: 77.837 kg (171 lb 9.6 oz)    Exam:   General:  Alert, pleasant, no distress  Cardiovascular: s1,s2 rrr  Respiratory: CTA BL  Abdomen: soft, nt,nd   Musculoskeletal: no LE edema   Data Reviewed: Basic Metabolic Panel:  Recent Labs Lab 04/05/14 1329 04/06/14 0505 04/08/14 0446  NA 137 140 135  K 3.8  4.6 4.8  CL 103 105 102  CO2 26 27 26   GLUCOSE 92 88 156*  BUN 17 16 19   CREATININE 0.93 0.85 0.74  CALCIUM 8.3* 8.1* 7.6*   Liver Function Tests: No results for input(s): AST, ALT, ALKPHOS, BILITOT, PROT, ALBUMIN in the last 168 hours. No results for input(s): LIPASE, AMYLASE in the last 168 hours. No results for input(s): AMMONIA in the last 168 hours. CBC:  Recent Labs Lab 04/05/14 1329 04/06/14 0505 04/08/14 0446  WBC 12.0* 9.6 10.0  NEUTROABS 10.1*  --   --   HGB 11.8* 11.3* 10.3*  HCT 36.0* 34.9* 32.0*  MCV 94.0 94.8 94.4  PLT 303 264 312   Cardiac Enzymes: No results for input(s): CKTOTAL, CKMB, CKMBINDEX, TROPONINI in the last 168 hours. BNP (last 3 results) No results for input(s): PROBNP in the last 8760 hours. CBG: No results for input(s): GLUCAP in the last 168 hours.  Recent Results (from the past 240 hour(s))  Surgical pcr screen     Status: None   Collection Time: 04/05/14  6:34 PM  Result Value Ref Range Status   MRSA, PCR NEGATIVE NEGATIVE Final   Staphylococcus aureus NEGATIVE NEGATIVE Final    Comment:        The Xpert SA Assay (FDA approved for NASAL specimens in patients over 46 years of age), is one component of a comprehensive surveillance program.  Test performance has been validated by [facility] for patients greater than or equal to 62 year old. It is not intended to diagnose infection nor to guide or monitor treatment.  Clostridium Difficile by PCR     Status: None   Collection Time: 04/06/14  5:20 AM  Result Value Ref Range Status   C difficile by pcr NEGATIVE NEGATIVE Final    Comment: Performed at [facility]     Studies: Dg C-arm 1-60 Min-no Report  04/07/2014   CLINICAL DATA:  Postoperative imaging after right total hip arthroplasty  EXAM: DG C-ARM 1-60 MIN - NRPT [facility]; DG HIP W/ PELVIS 1V PORT*R*  COMPARISON:  04/05/2014  FINDINGS: Interval placement of right total hip arthroplasty noted with subcutaneous gas. No  evidence for hardware failure. No fracture line visualized.  IMPRESSION: Expected postoperative appearance after right total hip arthroplasty.   Electronically Signed   By: Conchita Paris M.D.   On: 04/07/2014 16:46   Dg Hip Port Unilat With Pelvis 1v Right  04/07/2014   CLINICAL DATA:  Postoperative imaging after right total hip arthroplasty  EXAM: DG C-ARM 1-60 MIN - NRPT [facility]; DG HIP W/ PELVIS 1V PORT*R*  COMPARISON:  04/05/2014  FINDINGS: Interval placement of right total hip arthroplasty noted with subcutaneous gas. No evidence for hardware failure. No fracture line visualized.  IMPRESSION: Expected postoperative appearance after right total hip arthroplasty.   Electronically Signed   By: Conchita Paris M.D.   On: 04/07/2014 16:46    Scheduled Meds: . aspirin EC  325 mg Oral BID  . celecoxib  200 mg Oral Q12H  . vitamin B-12  500 mcg Oral Daily  . docusate sodium  100 mg Oral BID  . feeding supplement (ENSURE COMPLETE)  237 mL Oral BID BM  . ferrous sulfate  325 mg Oral TID PC  . HYDROcodone-acetaminophen  1-2 tablet Oral Q4H  . Influenza vac split quadrivalent PF  0.5 mL Intramuscular Tomorrow-1000  . multivitamin  1 tablet Oral Daily  . omega-3 acid ethyl esters  1 g Oral Daily  . polyethylene glycol  17 g Oral BID   Continuous Infusions: . sodium chloride 0.9 % 1,000 mL with potassium chloride 10 mEq infusion 100 mL/hr (04/07/14 1846)     Time spent: 25 minutes     Fidelis  [facility] If 7PM-7AM, please contact night-coverage at [email or web address], password [facility] 04/08/2014, 2:09 PM  LOS: 3 days

## 2014-04-08 NOTE — Evaluation (Signed)
Occupational Therapy Evaluation Patient Details Name: [name] MRN: [id number] DOB: [birth year] Today's Date: 04/08/2014    History of Present Illness R THR   Clinical Impression   This 81 year old man is s/p R DA THA.  He will benefit from skilled OT to increase safety and independence with adls.  Pt was having increasing difficulty performing BADLs prior to admission.  He needs overall A x 2 for safety:  Mod to Max A for LB adls and mod A x 2 for transfers.  Goals are set for min A level in acute.  He will benefit from OT at [facility] [place] after discharge from acute.    Follow Up Recommendations  SNF    Equipment Recommendations  3 in 1 bedside comode    Recommendations for Other Services       Precautions / Restrictions Precautions Precautions: Fall Restrictions Weight Bearing Restrictions: Yes RLE Weight Bearing: Partial weight bearing RLE Partial Weight Bearing Percentage or Pounds: 50%      Mobility Bed Mobility Overal bed mobility: Needs Assistance;+2 for physical assistance Bed Mobility: Sit to Supine       Sit to supine: Mod assist;+2 for physical assistance   General bed mobility comments: cues for technique.  Assist for LEs and to guide trunk  Transfers Overall transfer level: Needs assistance Equipment used: Rolling [place] (2 wheeled) Transfers: Sit to/from Stand Sit to Stand: Mod assist;+2 physical assistance         General transfer comment: cues for UE/LE placement    Balance                                            ADL Overall ADL's : Needs assistance/impaired                         Toilet Transfer: +2 for physical assistance;Stand-pivot;Moderate assistance (chair to bed)             General ADL Comments: pt is able to perform UB adls with set up seated.  He needs mod A x 2 for LB bathing and max A x 2 for LB dressing.  Educated on Public affairs consultant today:  pt did not try these.     Vision                      Perception     Praxis      Pertinent Vitals/Pain Pain Assessment: Faces Faces Pain Scale: Hurts a little bit Pain Location: R hip Pain Descriptors / Indicators: Aching Pain Intervention(s): Limited activity within patient's tolerance;Monitored during session;Premedicated before session;Ice applied     Hand Dominance     Extremity/Trunk Assessment Upper Extremity Assessment Upper Extremity Assessment: Overall WFL for tasks assessed (states R shoulder sore after using trapeze)           Communication Communication Communication: HOH   Cognition Arousal/Alertness: Awake/alert Behavior During Therapy: WFL for tasks assessed/performed Overall Cognitive Status: Within Functional Limits for tasks assessed                     General Comments       Exercises       Shoulder Instructions      Home Living Family/patient expects to be discharged to:: Skilled nursing facility  Prior Functioning/Environment Level of Independence: Needs assistance        Comments: Pt was struggling at home prior to surgery.  Stayed in reclliner, had falls and used bedpan in chair    OT Diagnosis: Generalized weakness   OT Problem List: Decreased strength;Decreased activity tolerance;Decreased knowledge of use of DME or AE;Pain;Decreased knowledge of precautions   OT Treatment/Interventions: Self-care/ADL training;DME and/or AE instruction;Patient/family education    OT Goals(Current goals can be found in the care plan section) Acute Rehab OT Goals Patient Stated Goal: Rehab at [facility] and then home OT Goal Formulation: With patient Time For Goal Achievement: 04/15/14 Potential to Achieve Goals: Good ADL Goals Pt Will Perform Lower Body Bathing: with min assist;with adaptive equipment;sit to/from stand Pt Will Perform Lower Body Dressing: with min assist;with adaptive equipment;sit to/from stand Pt Will  Transfer to Toilet: with min assist;stand pivot transfer;bedside commode  OT Frequency: Min 2X/week   Barriers to D/C:            Co-evaluation PT/OT/SLP Co-Evaluation/Treatment: Yes Reason for Co-Treatment: For patient/therapist safety PT goals addressed during session: Mobility/safety with mobility OT goals addressed during session: ADL's and self-care      End of Session    Activity Tolerance: Patient limited by fatigue Patient left: in bed;with call bell/phone within reach;with family/visitor present   Time: 0459-9774 OT Time Calculation (min): 16 min Charges:  OT General Charges $OT Visit: 1 Procedure OT Evaluation $Initial OT Evaluation Tier I: 1 Procedure G-Codes:    Zaia Carre 04/26/2014, 3:34 PM  Lesle Chris, OTR/L [phone] 2014-04-26

## 2014-04-08 NOTE — Care Management Note (Addendum)
    Page 1 of 1   04/08/2014     2:26:18 PM CARE MANAGEMENT NOTE 04/08/2014  Patient:  [name], [name]   Account Number:  [id number]  Date Initiated:  04/05/2014  Documentation initiated by:  [facility]  Subjective/Objective Assessment:   hip pain     Action/Plan:   lives alone   Anticipated DC Date:     Anticipated DC Plan:  SKILLED NURSING FACILITY  In-house referral  Clinical Social Worker      DC Planning Services  CM consult      Choice offered to / List presented to:             Status of service:  Completed, signed off Medicare Important Message given?   (If response is "NO", the following Medicare IM given date fields will be blank) Date Medicare IM given:   Medicare IM given by:   Date Additional Medicare IM given:   Additional Medicare IM given by:    Discharge Disposition:  [place]  Per UR Regulation:  Reviewed for med. necessity/level of care/duration of stay  If discussed at [place] of Stay Meetings, dates discussed:    Comments:  04/08/14 CM notes pt to go to SNF for rehab; CSW arranging. No other CM needs were communicated.  Mariane Masters, BSN, [place].   04/05/2014 1515 NCM spoke to pt and states he is unable to walk. Gave permission to speak to son, [name] # [phone]. States he lives in [place] and drove in today to check on pt. States his pt lives alone. NCM spoke to attending and pt will be evaluated for pain and gait. PT eval consult. CSW referral to discuss with son SNF.  Jonnie Finner RN CCM Case Mgmt phone [phone]

## 2014-04-08 NOTE — Progress Notes (Signed)
Physical Therapy Treatment Patient Details Name: [name] MRN: [id number] DOB: [birth year] Today's Date: 04/08/2014    History of Present Illness R THR    PT Comments    Increased stability with mobility this pm.  Follow Up Recommendations  SNF     Equipment Recommendations  None recommended by PT    Recommendations for Other Services OT consult     Precautions / Restrictions Precautions Precautions: Fall Restrictions Weight Bearing Restrictions: Yes RLE Weight Bearing: Partial weight bearing RLE Partial Weight Bearing Percentage or Pounds: 50%    Mobility  Bed Mobility Overal bed mobility: Needs Assistance;+2 for physical assistance Bed Mobility: Sit to Supine       Sit to supine: Mod assist;+2 for physical assistance   General bed mobility comments: cues for technique.  Assist for LEs and to guide trunk  Transfers Overall transfer level: Needs assistance Equipment used: Rolling [place] (2 wheeled) Transfers: Sit to/from Stand Sit to Stand: Mod assist;+2 physical assistance         General transfer comment: cues for UE/LE placement  Ambulation/Gait Ambulation/Gait assistance: Mod assist;+2 physical assistance;+2 safety/equipment Ambulation Distance (Feet): 3 Feet Assistive device: Rolling Caster (2 wheeled) Gait Pattern/deviations: Step-to pattern;Decreased step length - right;Decreased step length - left;Shuffle Gait velocity: decr   General Gait Details: Cues for posture, sequence, position from RW and PWB   Stairs            Wheelchair Mobility    Modified Rankin (Stroke Patients Only)       Balance                                    Cognition Arousal/Alertness: Awake/alert Behavior During Therapy: WFL for tasks assessed/performed Overall Cognitive Status: Within Functional Limits for tasks assessed                      Exercises      General Comments        Pertinent Vitals/Pain Pain  Assessment: Faces Faces Pain Scale: Hurts a little bit Pain Location: R hip Pain Descriptors / Indicators: Aching Pain Intervention(s): Limited activity within patient's tolerance;Monitored during session;Premedicated before session;Ice applied    Home Living Family/patient expects to be discharged to:: Skilled nursing facility                    Prior Function Level of Independence: Needs assistance      Comments: Pt was struggling at home prior to surgery.  Stayed in reclliner, had falls and used bedpan in chair   PT Goals (current goals can now be found in the care plan section) Acute Rehab PT Goals Patient Stated Goal: Rehab at [facility] and then home PT Goal Formulation: With patient Time For Goal Achievement: 04/15/14 Potential to Achieve Goals: Good Progress towards PT goals: Progressing toward goals    Frequency  7X/week    PT Plan Current plan remains appropriate    Co-evaluation   Reason for Co-Treatment: For patient/therapist safety PT goals addressed during session: Mobility/safety with mobility OT goals addressed during session: ADL's and self-care     End of Session Equipment Utilized During Treatment: Gait belt Activity Tolerance: Patient tolerated treatment well;Patient limited by fatigue Patient left: in bed;with call bell/phone within reach;with family/visitor present     Time: 6759-1638 PT Time Calculation (min) (ACUTE ONLY): 15 min  Charges:  $Therapeutic Activity: 8-22 mins  G Codes:      [name] May 07, 2014, 2:53 PM

## 2014-04-08 NOTE — Progress Notes (Addendum)
     Subjective: 1 Day Post-Op Procedure(s) (LRB): TOTAL HIP ARTHROPLASTY ANTERIOR APPROACH (Right) because of right hip primary OA / pain  Patient reports pain as mild, pain controlled. No events throughout the night.   Objective:   VITALS:   Filed Vitals:   04/08/14  BP: 86/56  Pulse: 98  Temp: 98.9 F (37.2 C)   Resp: 18    Dorsiflexion/Plantar flexion intact Incision: dressing C/D/I No cellulitis present Compartment soft  LABS  Recent Labs  04/05/14 1329 04/06/14 0505 04/08/14 0446  HGB 11.8* 11.3* 10.3*  HCT 36.0* 34.9* 32.0*  WBC 12.0* 9.6 10.0  PLT 303 264 312     Recent Labs  04/05/14 1329 04/06/14 0505 04/08/14 0446  NA 137 140 135  K 3.8 4.6 4.8  BUN 17 16 19   CREATININE 0.93 0.85 0.74  GLUCOSE 92 88 156*     Assessment/Plan: 1 Day Post-Op Procedure(s) (LRB): TOTAL HIP ARTHROPLASTY ANTERIOR APPROACH (Right) because of right hip primary OA / pain   Advance diet Up with therapy Discharge to SNF when ready, possibly tomorrow if he continues to progress    [place]. Vearl Aitken   PAC  04/08/2014, 9:51 AM

## 2014-04-08 NOTE — Evaluation (Signed)
Physical Therapy Evaluation Patient Details Name: [name] MRN: [id number] DOB: 1924/11/28 Today's Date: 04/08/2014   History of Present Illness  R THR  Clinical Impression  Pt s/p R THR presents with decreased R LE strength/ROM and post op pain limiting functional mobility.  Pt will benefit from follow up rehab at SNF level to maximize IND and safety prior to return home with ltd assist.    Follow Up Recommendations SNF    Equipment Recommendations  None recommended by PT    Recommendations for Other Services OT consult     Precautions / Restrictions Precautions Precautions: Fall Restrictions Weight Bearing Restrictions: Yes RLE Weight Bearing: Partial weight bearing RLE Partial Weight Bearing Percentage or Pounds: 50%      Mobility  Bed Mobility Overal bed mobility: Needs Assistance;+2 for physical assistance Bed Mobility: Supine to Sit     Supine to sit: Mod assist;+2 for physical assistance     General bed mobility comments: cues for sequence and use of L LE to self assist   Transfers Overall transfer level: Needs assistance Equipment used: Rolling [place] (2 wheeled) Transfers: Sit to/from Stand Sit to Stand: Mod assist;+2 physical assistance         General transfer comment: cues for LE management and use of UEs to self assist  Ambulation/Gait Ambulation/Gait assistance: Mod assist;+2 physical assistance;+2 safety/equipment Ambulation Distance (Feet): 5 Feet Assistive device: Rolling [place] (2 wheeled) Gait Pattern/deviations: Step-to pattern;Decreased step length - right;Decreased step length - left;Shuffle Gait velocity: decr   General Gait Details: Cues for posture, sequence, position from RW and PWB  Stairs            Wheelchair Mobility    Modified Rankin (Stroke Patients Only)       Balance Overall balance assessment: Needs assistance Sitting-balance support: Bilateral upper extremity supported Sitting balance-Leahy Scale:  Fair     Standing balance support: Bilateral upper extremity supported Standing balance-Leahy Scale: Poor                               Pertinent Vitals/Pain Pain Assessment: 0-10 Pain Score: 5  Pain Location: R hip Pain Descriptors / Indicators: Aching;Sore Pain Intervention(s): Limited activity within patient's tolerance;Monitored during session;Premedicated before session;Ice applied    Home Living Family/patient expects to be discharged to:: Skilled nursing facility                      Prior Function Level of Independence: Needs assistance         Comments: Pt was struggling at home prior to surgery.  Stayed in reclliner, had falls and used bedpan in chair     Hand Dominance        Extremity/Trunk Assessment   Upper Extremity Assessment: Overall WFL for tasks assessed           Lower Extremity Assessment: RLE deficits/detail RLE Deficits / Details: Hip strength 2/5 with AAROM at hip to 75 flex and 15 abd    Cervical / Trunk Assessment: Kyphotic  Communication   Communication: HOH  Cognition Arousal/Alertness: Awake/alert Behavior During Therapy: WFL for tasks assessed/performed Overall Cognitive Status: Within Functional Limits for tasks assessed                      General Comments      Exercises Total Joint Exercises Ankle Circles/Pumps: AROM;Both;10 reps;Supine Quad Sets: AROM;Both;10 reps;Supine Heel Slides: AAROM;Right;15 reps;Supine Hip  ABduction/ADduction: AAROM;Right;10 reps;Supine      Assessment/Plan    PT Assessment Patient needs continued PT services  PT Diagnosis Difficulty walking   PT Problem List Decreased strength;Decreased range of motion;Decreased activity tolerance;Decreased mobility;Pain;Decreased knowledge of use of DME;Decreased balance  PT Treatment Interventions DME instruction;Gait training;Functional mobility training;Therapeutic activities;Therapeutic exercise;Patient/family education    PT Goals (Current goals can be found in the Care Plan section) Acute Rehab PT Goals Patient Stated Goal: [facility] at [facility] and then home PT Goal Formulation: With patient Time For Goal Achievement: 04/15/14 Potential to Achieve Goals: Good    Frequency 7X/week   Barriers to discharge        Co-evaluation               End of Session Equipment Utilized During Treatment: Gait belt Activity Tolerance: Patient tolerated treatment well;Patient limited by fatigue Patient left: in chair;with call bell/phone within reach Nurse Communication: Mobility status         Time: 7414-2395 PT Time Calculation (min) (ACUTE ONLY): 35 min   Charges:   PT Evaluation $Initial PT Evaluation Tier I: 1 Procedure PT Treatments $Gait Training: 8-22 mins $Therapeutic Exercise: 8-22 mins   PT G Codes:        Doyne Micke 04/09/2014, 1:12 PM

## 2014-04-09 DIAGNOSIS — D508 Other iron deficiency anemias: Secondary | ICD-10-CM | POA: Insufficient documentation

## 2014-04-09 LAB — BASIC METABOLIC PANEL
ANION GAP: 10 (ref 5–15)
BUN: 36 mg/dL — ABNORMAL HIGH (ref 6–23)
CHLORIDE: 104 mmol/L (ref 96–112)
CO2: 25 mmol/L (ref 19–32)
CREATININE: 0.97 mg/dL (ref 0.50–1.35)
Calcium: 8.2 mg/dL — ABNORMAL LOW (ref 8.4–10.5)
GFR calc non Af Amer: 72 mL/min — ABNORMAL LOW (ref >90)
GFR, EST AFRICAN AMERICAN: 83 mL/min — AB (ref >90)
Glucose, Bld: 129 mg/dL — ABNORMAL HIGH (ref 70–99)
POTASSIUM: 5.2 mmol/L — AB (ref 3.5–5.1)
SODIUM: 139 mmol/L (ref 135–145)

## 2014-04-09 LAB — CBC
HEMATOCRIT: 32.8 % — AB (ref 39.0–52.0)
HEMOGLOBIN: 10.5 g/dL — AB (ref 13.0–17.0)
MCH: 30.3 pg (ref 26.0–34.0)
MCHC: 32 g/dL (ref 30.0–36.0)
MCV: 94.8 fL (ref 78.0–100.0)
Platelets: 346 10*3/uL (ref 150–400)
RBC: 3.46 MIL/uL — AB (ref 4.22–5.81)
RDW: 15 % (ref 11.5–15.5)
WBC: 13.1 10*3/uL — ABNORMAL HIGH (ref 4.0–10.5)

## 2014-04-09 MED ORDER — DOCUSATE SODIUM 100 MG PO CAPS: 100.0000 mg | ORAL_CAPSULE | Freq: Two times a day (BID) | ORAL | Status: AC

## 2014-04-09 MED ORDER — FERROUS SULFATE 325 (65 FE) MG PO TABS: 325.0000 mg | ORAL_TABLET | Freq: Three times a day (TID) | ORAL | Status: AC

## 2014-04-09 MED ORDER — CELECOXIB 200 MG PO CAPS: 200.0000 mg | ORAL_CAPSULE | Freq: Two times a day (BID) | ORAL | Status: AC

## 2014-04-09 MED ORDER — ASPIRIN 325 MG PO TBEC: 325.0000 mg | DELAYED_RELEASE_TABLET | Freq: Two times a day (BID) | ORAL | Status: DC

## 2014-04-09 MED ORDER — ENSURE COMPLETE PO LIQD: 237.0000 mL | Freq: Two times a day (BID) | ORAL | Status: AC

## 2014-04-09 MED ORDER — HYDROCODONE-ACETAMINOPHEN 7.5-325 MG PO TABS: 1.0000 | ORAL_TABLET | ORAL | Status: DC

## 2014-04-09 MED ORDER — POLYETHYLENE GLYCOL 3350 17 G PO PACK: 17.0000 g | PACK | Freq: Every day | ORAL | Status: AC | PRN

## 2014-04-09 MED ORDER — TRAMADOL HCL 50 MG PO TABS: 50.0000 mg | ORAL_TABLET | Freq: Four times a day (QID) | ORAL | Status: AC | PRN

## 2014-04-09 MED ORDER — ASPIRIN 325 MG PO TBEC: 325.0000 mg | DELAYED_RELEASE_TABLET | Freq: Two times a day (BID) | ORAL | Status: AC

## 2014-04-09 MED ORDER — TIZANIDINE HCL 4 MG PO TABS: 4.0000 mg | ORAL_TABLET | Freq: Four times a day (QID) | ORAL | Status: AC | PRN

## 2014-04-09 MED ORDER — METHOCARBAMOL 500 MG PO TABS: 500.0000 mg | ORAL_TABLET | Freq: Four times a day (QID) | ORAL | Status: DC | PRN

## 2014-04-09 MED ORDER — HYDROCODONE-ACETAMINOPHEN 7.5-325 MG PO TABS: 1.0000 | ORAL_TABLET | ORAL | Status: AC | PRN

## 2014-04-09 NOTE — Progress Notes (Signed)
Physical Therapy Treatment Patient Details Name: [name] MRN: [id number] DOB: 26-Jul-1924 Today's Date: Apr 26, 2014    History of Present Illness R THR    PT Comments    Pt continues motivated and progressing steadily with mobility  Follow Up Recommendations  SNF     Equipment Recommendations  None recommended by PT    Recommendations for Other Services OT consult     Precautions / Restrictions Precautions Precautions: Fall Restrictions Weight Bearing Restrictions: Yes RLE Weight Bearing: Partial weight bearing RLE Partial Weight Bearing Percentage or Pounds: 50    Mobility  Bed Mobility Overal bed mobility: Needs Assistance;+2 for physical assistance Bed Mobility: Sit to Supine       Sit to supine: Min assist;Mod assist;+2 for physical assistance   General bed mobility comments: cues for technique.  Assist for LEs and to guide trunk  Transfers Overall transfer level: Needs assistance Equipment used: Rolling Mailloux (2 wheeled) Transfers: Sit to/from Stand Sit to Stand: Min assist;Mod assist;+2 physical assistance;+2 safety/equipment         General transfer comment: cues for UE/LE placement  Ambulation/Gait Ambulation/Gait assistance: Mod assist;+2 safety/equipment Ambulation Distance (Feet): 18 Feet Assistive device: Rolling Rebello (2 wheeled) Gait Pattern/deviations: Step-to pattern;Decreased step length - right;Decreased step length - left;Shuffle;Trunk flexed Gait velocity: decr   General Gait Details: Cues for posture, sequence, position from RW and PWB   Stairs            Wheelchair Mobility    Modified Rankin (Stroke Patients Only)       Balance                                    Cognition Arousal/Alertness: Awake/alert Behavior During Therapy: WFL for tasks assessed/performed Overall Cognitive Status: Within Functional Limits for tasks assessed                      Exercises      General  Comments        Pertinent Vitals/Pain Pain Assessment: 0-10 Pain Score: 2  Pain Location: R hip Pain Descriptors / Indicators: Aching Pain Intervention(s): Limited activity within patient's tolerance;Monitored during session;Premedicated before session;Ice applied    Home Living                      Prior Function            PT Goals (current goals can now be found in the care plan section) Acute Rehab PT Goals Patient Stated Goal: Rehab at [facility] and then home PT Goal Formulation: With patient Time For Goal Achievement: 04/15/14 Potential to Achieve Goals: Good Progress towards PT goals: Progressing toward goals    Frequency  7X/week    PT Plan Current plan remains appropriate    Co-evaluation             End of Session Equipment Utilized During Treatment: Gait belt Activity Tolerance: Patient tolerated treatment well Patient left: in bed;with call bell/phone within reach;with family/visitor present     Time: 6063-0160 PT Time Calculation (min) (ACUTE ONLY): 18 min  Charges:  $Gait Training: 8-22 mins                    G Codes:      [name] 04/26/14, 3:34 PM

## 2014-04-09 NOTE — Progress Notes (Signed)
Physical Therapy Treatment Patient Details Name: [name] MRN: [id number] DOB: [birth year] Today's Date: 04/09/2014    History of Present Illness R THR    PT Comments    Good progress with mobility with pt demonstrating better adherence to [facility] on R LE  Follow Up Recommendations  SNF     Equipment Recommendations  None recommended by PT    Recommendations for Other Services OT consult     Precautions / Restrictions Precautions Precautions: Fall Restrictions Weight Bearing Restrictions: Yes RLE Weight Bearing: Partial weight bearing RLE Partial Weight Bearing Percentage or Pounds: 50    Mobility  Bed Mobility Overal bed mobility: Needs Assistance;+2 for physical assistance Bed Mobility: Supine to Sit     Supine to sit: Mod assist     General bed mobility comments: cues for technique.  Assist for LEs and to guide trunk  Transfers Overall transfer level: Needs assistance Equipment used: Rolling Magnussen (2 wheeled) Transfers: Sit to/from Stand Sit to Stand: Mod assist;+2 safety/equipment         General transfer comment: cues for UE/LE placement  Ambulation/Gait Ambulation/Gait assistance: Mod assist Ambulation Distance (Feet): 26 Feet Assistive device: Rolling Leet (2 wheeled) Gait Pattern/deviations: Step-to pattern;Decreased step length - right;Decreased step length - left;Shuffle;Trunk flexed Gait velocity: decr   General Gait Details: Cues for posture, sequence, position from RW and PWB   Stairs            Wheelchair Mobility    Modified Rankin (Stroke Patients Only)       Balance                                    Cognition Arousal/Alertness: Awake/alert Behavior During Therapy: WFL for tasks assessed/performed Overall Cognitive Status: Within Functional Limits for tasks assessed                      Exercises Total Joint Exercises Ankle Circles/Pumps: AROM;Both;10 reps;Supine Quad Sets:  AROM;Both;10 reps;Supine Heel Slides: AAROM;Right;15 reps;Supine Hip ABduction/ADduction: AAROM;Right;10 reps;Supine    General Comments        Pertinent Vitals/Pain Pain Assessment: 0-10 Pain Score: 3  Pain Location: R hip Pain Descriptors / Indicators: Aching;Sore Pain Intervention(s): Limited activity within patient's tolerance;Monitored during session;Premedicated before session    Home Living                      Prior Function            PT Goals (current goals can now be found in the care plan section) Acute Rehab PT Goals Patient Stated Goal: Rehab at [facility] and then home PT Goal Formulation: With patient Time For Goal Achievement: 04/15/14 Potential to Achieve Goals: Good Progress towards PT goals: Progressing toward goals    Frequency  7X/week    PT Plan Current plan remains appropriate    Co-evaluation             End of Session Equipment Utilized During Treatment: Gait belt Activity Tolerance: Patient tolerated treatment well Patient left: in chair;with call bell/phone within reach;with family/visitor present;with nursing/sitter in room     Time: 1115-1145 PT Time Calculation (min) (ACUTE ONLY): 30 min  Charges:  $Gait Training: 8-22 mins $Therapeutic Exercise: 8-22 mins                    G Codes:  Gearald Stonebraker 04/09/2014, 12:39 PM

## 2014-04-09 NOTE — Discharge Summary (Signed)
Physician Discharge Summary  [name] WYO:[id number] DOB: 1924-03-26 DOA: 04/05/2014  PCP: Franklin Ly, MD  Admit date: 04/05/2014 Discharge date: 04/09/2014  Time spent: 45 minutes   Discharge Condition: stable Diet recommendation: heart healthy  Discharge Diagnoses:  Principal Problem:   Right hip pain Active Problems:   Generalized weakness   Anemia   S/P right THA, AA   History of present illness:  [name] is a 81 y.o. 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 Patient reports that he has had chronic right hip pain for 4 years. However over the last 4-3 weeks he has had worsening right hip pain. He had followed up with his orthopedic physician, Dr. Ihor Schaefer, who has scheduled him to have surgery in February for hip replacement. However, patient due to worsening right hip pain has been immobile and has had poor appetite at home. He reports having fallen onto his knees 2 days ago denies hitting his head or losing consciousness. He has been having his neighbors helping get up to even go to the bathroom. Due to his inability to walk and worsening functional status and home, he presented to the emergency department for further care and management. ED physician spoke with Dr. Wynelle Schaefer, who recommended hospitalist service admit the patient for further care and management.   Hospital Course:  Right hip pain suspected due to osteoarthritis. Right hip x-ray shows finding consistent with prior fracture with significant remodeling of proximal femur and superior subluxation. - the patient had been scheduled for a hip replacement for 2/23 however as he continues to have falls, it was decided to go ahead and do the replacement during this admission  -s/p right total hip replacement on 1/26 - recovering well -patient denies any cardiopulmonary symptoms-   Of note, patient was recently evaluated by cardiology Dr. Percival Schaefer on 03/23/2014 patient was deemed "based on ACC/AHA guidelines, the patient would be at acceptable risk for the planned procedure without further cardiovascular testing."  Generalized weakness Due to the above. Continue PT/OT. SNF placement.   Anemia  - Hb has been stable.  - anemia panel reveals Iron deficiency- have str  Primary Osteoporosis Resume bisphosphonates and calcium after discharge.  Procedures:  Right total hip replacement  Consultations:  ortho  Discharge Exam: Filed Weights   04/05/14 1851  Weight: 77.837 kg (171 lb 9.6 oz)   Filed Vitals:   04/09/14 1345  BP: 102/48  Pulse: 88  Temp: 97.5 F (36.4 C)  Resp: 20    General: AAO x 3, no distress Cardiovascular: RRR, no murmurs  Respiratory: clear to auscultation bilaterally GI: soft, non-tender, non-distended, bowel sound positive Skin: skin tear on left knee- no infection noted, numerous bruises noted on arms and legs  Discharge Instructions You were cared for by a hospitalist during your hospital stay. If you have any questions about your discharge medications or the care you received while you were in the hospital after you are discharged, you can call the unit and asked to speak with the hospitalist on call if the hospitalist that took care of you is not available. Once you are discharged, your primary care physician will handle any further medical issues. Please note that NO REFILLS for any discharge medications will be authorized once you are discharged, as it is imperative that you return to your primary care physician (or establish a relationship with a primary care  physician if you do not have one) for your aftercare needs so that they can reassess your need for medications and monitor your lab values.      Discharge Instructions    Call MD / Call 911    Complete by:  As directed   If you experience chest pain or shortness of  breath, CALL 911 and be transported to the hospital emergency room.  If you develope a fever above 101 F, pus (white drainage) or increased drainage or redness at the wound, or calf pain, call your surgeon's office.     Change dressing    Complete by:  As directed   Maintain surgical dressing until follow up in the clinic. If the edges start to pull up, may reinforce with tape. If the dressing is no longer working, may remove and cover with gauze and tape, but must keep the area dry and clean.  Call with any questions or concerns.     Constipation Prevention    Complete by:  As directed   Drink plenty of fluids.  Prune juice may be helpful.  You may use a stool softener, such as Colace (over the counter) 100 mg twice a day.  Use MiraLax (over the counter) for constipation as needed.     Diet - low sodium heart healthy    Complete by:  As directed      Discharge instructions    Complete by:  As directed   Maintain surgical dressing until follow up in the clinic. If the edges start to pull up, may reinforce with tape. If the dressing is no longer working, may remove and cover with gauze and tape, but must keep the area dry and clean.  Follow up in 2 weeks at [facility]. Call with any questions or concerns.     Partial weight bearing    Complete by:  As directed   % Body Weight:  50  Laterality:  right  Extremity:  Lower     TED hose    Complete by:  As directed   Use stockings (TED hose) for 2 weeks on both leg(s).  You may remove them at night for sleeping.            Medication List    STOP taking these medications        alendronate 70 MG tablet  Commonly known as:  FOSAMAX     aspirin 81 MG tablet  Replaced by:  aspirin 325 MG EC tablet     naproxen sodium 220 MG tablet  Commonly known as:  ANAPROX     sulfamethoxazole-trimethoprim 800-160 MG per tablet  Commonly known as:  BACTRIM DS,SEPTRA DS      TAKE these medications        aspirin 325 MG EC tablet   Take 1 tablet (325 mg total) by mouth 2 (two) times daily.     CALCIUM 600 PO  Take 1 tablet by mouth daily.     celecoxib 200 MG capsule  Commonly known as:  CELEBREX  Take 1 capsule (200 mg total) by mouth every 12 (twelve) hours.     Chlorhexidine Gluconate 4 % Soln  Apply 1 application topically daily. Apply head to toe body wash daily 5 days prior to surgery     docusate sodium 100 MG capsule  Commonly known as:  COLACE  Take 1 capsule (100 mg total) by mouth 2 (two) times daily.     feeding supplement (ENSURE COMPLETE) Liqd  Take 237 mLs by mouth 2 (two) times daily between meals.     ferrous sulfate 325 (65 FE) MG tablet  Take 1 tablet (325 mg total) by mouth 3 (three) times daily after meals.     Fish Oil 1000 MG Caps  Take 1 capsule by mouth daily.     furosemide 20 MG tablet  Commonly known as:  LASIX  Take 1 tablet by mouth daily.     HYDROcodone-acetaminophen 7.5-325 MG per tablet  Commonly known as:  NORCO  Take 1-2 tablets by mouth every 4 (four) hours as needed for moderate pain.     multivitamin Tabs tablet  Take 1 tablet by mouth daily.     multivitamins ther. w/minerals Tabs tablet  Take 1 tablet by mouth daily.     mupirocin ointment 2 %  Commonly known as:  BACTROBAN  Place 1 application into the nose 3 (three) times daily. 5 days prior to surgery     polyethylene glycol packet  Commonly known as:  MIRALAX / GLYCOLAX  Take 17 g by mouth daily as needed for mild constipation.     tiZANidine 4 MG tablet  Commonly known as:  ZANAFLEX  Take 1 tablet (4 mg total) by mouth every 6 (six) hours as needed for muscle spasms.     traMADol 50 MG tablet  Commonly known as:  ULTRAM  Take 1 tablet (50 mg total) by mouth every 6 (six) hours as needed.     vitamin B-12 500 MCG tablet  Commonly known as:  CYANOCOBALAMIN  Take 500 mcg by mouth daily.       No Known Allergies Follow-up Information    Follow up with Mauri Pole, MD. Schedule an  appointment as soon as possible for a visit in 2 weeks.   Specialty:  Orthopedic Surgery   Contact information:   [address] [place] [address] [place] [address] [phone]        The results of significant diagnostics from this hospitalization (including imaging, microbiology, ancillary and laboratory) are listed below for reference.    Significant Diagnostic Studies: Chest Portable 1 View  04/05/2014   CLINICAL DATA:  Weakness. Right hip deformity with planned possible arthroplasty. Initial encounter.  EXAM: PORTABLE CHEST - 1 VIEW  COMPARISON:  None.  FINDINGS: 1840 hr. The heart size and mediastinal contours are normal for age. There is mild aortic tortuosity. The lungs are clear. There is no pleural effusion or pneumothorax. No acute osseous findings are evident. There are mild glenohumeral and lower cervical spine degenerative changes.  IMPRESSION: No active cardiopulmonary process or acute osseous findings.   Electronically Signed   By: Camie Patience M.D.   On: 04/05/2014 18:51   Dg C-arm 1-60 Min-no Report  04/07/2014   CLINICAL DATA:  Postoperative imaging after right total hip arthroplasty  EXAM: DG C-ARM 1-60 MIN - NRPT [facility]; DG HIP W/ PELVIS 1V PORT*R*  COMPARISON:  04/05/2014  FINDINGS: Interval placement of right total hip arthroplasty noted with subcutaneous gas. No evidence for hardware failure. No fracture line visualized.  IMPRESSION: Expected postoperative appearance after right total hip arthroplasty.   Electronically Signed   By: Conchita Paris M.D.   On: 04/07/2014 16:46   Dg Hip Port Unilat With Pelvis 1v Right  04/07/2014   CLINICAL DATA:  Postoperative imaging after right total hip arthroplasty  EXAM: DG C-ARM 1-60 MIN - NRPT [facility]; DG HIP W/ PELVIS 1V PORT*R*  COMPARISON:  04/05/2014  FINDINGS: Interval placement  of right total hip arthroplasty noted with subcutaneous gas. No evidence for hardware failure. No fracture line visualized.  IMPRESSION: Expected  postoperative appearance after right total hip arthroplasty.   Electronically Signed   By: Conchita Paris M.D.   On: 04/07/2014 16:46   Dg Hip Unilat With Pelvis 2-3 Views Right  04/05/2014   CLINICAL DATA:  Right hip pain for 1 year following a previous injury, recent fall with increasing hip pain  EXAM: DG HIP W/ PELVIS 2-3V*R*  COMPARISON:  None.  FINDINGS: The pelvic ring is intact. There are chronic changes involving the right proximal femur with prior fracture and breakdown of the femoral head and femoral neck. The femur is displaced superiorly along the superior lateral aspect of the acetabulum. Some dystrophic calcifications are seen. No gross soft tissue abnormality is noted.  IMPRESSION: Changes consistent with prior fracture with significant remodeling of the proximal femur and superior subluxation. No definitive acute fracture is identified on this exam.   Electronically Signed   By: Inez Catalina M.D.   On: 04/05/2014 14:08    Microbiology: Recent Results (from the past 240 hour(s))  Surgical pcr screen     Status: None   Collection Time: 04/05/14  6:34 PM  Result Value Ref Range Status   MRSA, PCR NEGATIVE NEGATIVE Final   Staphylococcus aureus NEGATIVE NEGATIVE Final    Comment:        The Xpert SA Assay (FDA approved for NASAL specimens in patients over 39 years of age), is one component of a comprehensive surveillance program.  Test performance has been validated by [facility] for patients greater than or equal to 42 year old. It is not intended to diagnose infection nor to guide or monitor treatment.   Clostridium Difficile by PCR     Status: None   Collection Time: 04/06/14  5:20 AM  Result Value Ref Range Status   C difficile by pcr NEGATIVE NEGATIVE Final    Comment: Performed at [place]: Basic Metabolic Panel:  Recent Labs Lab 04/05/14 1329 04/06/14 0505 04/08/14 0446 04/09/14 0445  NA 137 140 135 139  K 3.8 4.6 4.8 5.2*  CL 103  105 102 104  CO2 26 27 26 25   GLUCOSE 92 88 156* 129*  BUN 17 16 19  36*  CREATININE 0.93 0.85 0.74 0.97  CALCIUM 8.3* 8.1* 7.6* 8.2*   Liver Function Tests: No results for input(s): AST, ALT, ALKPHOS, BILITOT, PROT, ALBUMIN in the last 168 hours. No results for input(s): LIPASE, AMYLASE in the last 168 hours. No results for input(s): AMMONIA in the last 168 hours. CBC:  Recent Labs Lab 04/05/14 1329 04/06/14 0505 04/08/14 0446 04/09/14 0445  WBC 12.0* 9.6 10.0 13.1*  NEUTROABS 10.1*  --   --   --   HGB 11.8* 11.3* 10.3* 10.5*  HCT 36.0* 34.9* 32.0* 32.8*  MCV 94.0 94.8 94.4 94.8  PLT 303 264 312 346   Cardiac Enzymes: No results for input(s): CKTOTAL, CKMB, CKMBINDEX, TROPONINI in the last 168 hours. BNP: BNP (last 3 results) No results for input(s): PROBNP in the last 8760 hours. CBG: No results for input(s): GLUCAP in the last 168 hours.     SignedDebbe Odea, MD [facility] 04/09/2014, 3:00 PM

## 2014-04-09 NOTE — Progress Notes (Addendum)
     Subjective: 2 Days Post-Op Procedure(s) (LRB): TOTAL HIP ARTHROPLASTY ANTERIOR APPROACH (Right) because of right hip primary OA / pain  Patient reports pain as mild, pain controlled. No events throughout the night.  Doing so much better than he was prior to surgery. Ready to be discharged to skilled nursing facility.   Objective:   VITALS:   Filed Vitals:   04/09/14  BP: 92/55  Pulse: 79  Temp: 96.8 F (36 C)   Resp: 16    Dorsiflexion/Plantar flexion intact Incision: dressing C/D/I No cellulitis present Compartment soft  LABS  Recent Labs  04/08/14 0446 04/09/14 0445  HGB 10.3* 10.5*  HCT 32.0* 32.8*  WBC 10.0 13.1*  PLT 312 346     Recent Labs  04/08/14 0446 04/09/14 0445  NA 135 139  K 4.8 5.2*  BUN 19 36*  CREATININE 0.74 0.97  GLUCOSE 156* 129*     Assessment/Plan: 2 Days Post-Op Procedure(s) (LRB): TOTAL HIP ARTHROPLASTY ANTERIOR APPROACH (Right) because of right hip primary OA / pain Up with therapy Discharge to SNF  Follow up in 2 weeks at [facility]. Follow up with OLIN,Ragena Fiola D in 2 weeks.  Contact information:  [facility] [address], [address] [place] [address] [phone]    Ortho recommendations:  ASA 325 mg bid for 4 weeks for anticoagulation, unless other medically indicated.  Norco for pain management (Rx written).  Zanaflex for muscle spasms (Rx written).  MiraLax and Colace for constipation  Iron 325 mg tid for 2-3 weeks   WB 50% on the right leg.  Dressing to remain in place until follow in clinic in 2 weeks.  Dressing is waterproof and may shower with it in place.  Follow up in 2 weeks at [facility]. Follow up with OLIN,Christohper Dube D in 2 weeks.  Contact information:  [facility] [address] [place] [place]   PAC  04/09/2014, 1:12 PM

## 2014-04-09 NOTE — Progress Notes (Signed)
Clinical Social Work Department CLINICAL SOCIAL WORK PLACEMENT NOTE 04/09/2014  Patient:  [name], [name]  Account Number:  [id number] Admit date:  04/05/2014  Clinical Social Worker:  [name], LCSW  Date/time:  04/06/2014 01:12 PM  Clinical Social Work is seeking post-discharge placement for this patient at the following level of care:   SKILLED NURSING   (*CSW will update this form in Epic as items are completed)     Patient/family provided with [place] Department of Clinical Social Work's list of facilities offering this level of care within the geographic area requested by the patient (or if unable, by the patient's family).  04/06/2014  Patient/family informed of their freedom to choose among providers that offer the needed level of care, that participate in Medicare, Medicaid or managed care program needed by the patient, have an available bed and are willing to accept the patient.    Patient/family informed of [facility]' ownership interest in [facility], as well as of the fact that they are under no obligation to receive care at this facility.  PASARR submitted to EDS on 04/05/2014 PASARR number received on 04/05/2014  FL2 transmitted to all facilities in geographic area requested by pt/family on  04/06/2014 FL2 transmitted to all facilities within larger geographic area on   Patient informed that his/her managed care company has contracts with or will negotiate with  certain facilities, including the following:     Patient/family informed of bed offers received:  04/06/2014 Patient chooses bed at [place] Physician recommends and patient chooses bed at    Patient to be transferred to [place] on  04/09/2014 Patient to be transferred to facility by [facility] Patient and family notified of transfer on 04/09/2014 Name of family member notified:  SON  The following physician request were entered in Epic:   Additional Comments: Pt / son are in  agreement with d/c plan to SNF today. [facility] transport is required. NSG reviewed d/c summary, scripts, avs. Scripts included in d/c packet.  [name] LCSW [phone]

## 2014-04-09 NOTE — Progress Notes (Addendum)
Pt has ST Rehab bed at [facility] if stable for d/c today. CSW will assist with d/c planning to SNF.  [name] LCSW [phone]

## 2014-04-10 ENCOUNTER — Encounter: Payer: Self-pay | Admitting: Adult Health

## 2014-04-10 ENCOUNTER — Other Ambulatory Visit: Payer: Self-pay | Admitting: *Deleted

## 2014-04-10 ENCOUNTER — Non-Acute Institutional Stay (SKILLED_NURSING_FACILITY): Payer: Medicare Other | Admitting: Adult Health

## 2014-04-10 DIAGNOSIS — R531 Weakness: Secondary | ICD-10-CM

## 2014-04-10 DIAGNOSIS — Z9660 Presence of unspecified orthopedic joint implant: Secondary | ICD-10-CM

## 2014-04-10 DIAGNOSIS — M1611 Unilateral primary osteoarthritis, right hip: Secondary | ICD-10-CM

## 2014-04-10 DIAGNOSIS — K5900 Constipation, unspecified: Secondary | ICD-10-CM

## 2014-04-10 DIAGNOSIS — D62 Acute posthemorrhagic anemia: Secondary | ICD-10-CM

## 2014-04-10 DIAGNOSIS — Z96649 Presence of unspecified artificial hip joint: Secondary | ICD-10-CM

## 2014-04-10 DIAGNOSIS — R600 Localized edema: Secondary | ICD-10-CM

## 2014-04-10 NOTE — Progress Notes (Signed)
Discharge summary sent to payer through [employer]  

## 2014-04-10 NOTE — Progress Notes (Signed)
Patient ID: [name], male   DOB: [birth year], 81 y.o.   MRN: [id number]   04/10/2014  Facility:  Nursing Home Location:  [place] Room Number: 258-5 LEVEL OF CARE:  SNF (31)   Chief Complaint  Patient presents with  . Hospitalization Follow-up    Generalized weakness, Osteoarthritis S/P right total hip arthroplasty, anemia, edema, osteoporosis and constipation    HISTORY OF PRESENT ILLNESS:  This is an 81 year old male who has been admitted to [facility] on 04/09/14 from [facility]. He has past medical history of osteomyelitis S/P Left toe amputation. He has been having chronic right hip pain X 4 years. He had followed-up with his orthopedic, Dr. Ihor Gully, and was scheduled for hip replacement in February. He had been having poor appetite and neighbors helping him get up even to the bathroom. He had fallen 2 days prior to hospitalization and had right total hip arthroplasty. He has been admitted for a short-term rehabilitation.  PAST MEDICAL HISTORY:  Past Medical History  Diagnosis Date  . Pneumonia   . Osteomyelitis of toe of left foot 06/07/2011  . MRSA (methicillin resistant staph aureus) culture positive 06/07/2011  . Osteoporosis   . Coronary artery disease   . Heart murmur     CURRENT MEDICATIONS: Reviewed per MAR/see medication list  No Known Allergies   REVIEW OF SYSTEMS:  GENERAL: no change in appetite, no fatigue, no weight changes, no fever, chills or weakness RESPIRATORY: no cough, SOB, DOE, wheezing, hemoptysis CARDIAC: no chest pain, or palpitations GI: no abdominal pain, diarrhea, heart burn, nausea or vomiting, +constipation  PHYSICAL EXAMINATION  GENERAL: no acute distress, normal body habitus SKIN:  Skin tear to left knee and right hip surgical site is dry, no redness EYES: conjunctivae normal, sclerae normal, normal eye lids NECK: supple, trachea midline, no neck masses, no thyroid tenderness, no  thyromegaly LYMPHATICS: no LAN in the neck, no supraclavicular LAN RESPIRATORY: breathing is even & unlabored, BS CTAB CARDIAC: RRR, no murmur,no extra heart sounds, BLE edema 1+ GI: abdomen soft, normal BS, no masses, no tenderness, no hepatomegaly, no splenomegaly EXTREMITIES: Able to move 4 extremities PSYCHIATRIC: the patient is alert & oriented to person, affect & behavior appropriate  LABS/RADIOLOGY: Labs reviewed: Basic Metabolic Panel:  Recent Labs  04/06/14 0505 04/08/14 0446 04/09/14 0445  NA 140 135 139  K 4.6 4.8 5.2*  CL 105 102 104  CO2 27 26 25   GLUCOSE 88 156* 129*  BUN 16 19 36*  CREATININE 0.85 0.74 0.97  CALCIUM 8.1* 7.6* 8.2*   CBC:  Recent Labs  04/05/14 1329 04/06/14 0505 04/08/14 0446 04/09/14 0445  WBC 12.0* 9.6 10.0 13.1*  NEUTROABS 10.1*  --   --   --   HGB 11.8* 11.3* 10.3* 10.5*  HCT 36.0* 34.9* 32.0* 32.8*  MCV 94.0 94.8 94.4 94.8  PLT 303 264 312 346   Chest Portable 1 View  04/05/2014   CLINICAL DATA:  Weakness. Right hip deformity with planned possible arthroplasty. Initial encounter.  EXAM: PORTABLE CHEST - 1 VIEW  COMPARISON:  None.  FINDINGS: 1840 hr. The heart size and mediastinal contours are normal for age. There is mild aortic tortuosity. The lungs are clear. There is no pleural effusion or pneumothorax. No acute osseous findings are evident. There are mild glenohumeral and lower cervical spine degenerative changes.  IMPRESSION: No active cardiopulmonary process or acute osseous findings.   Electronically Signed   By: Rush Landmark  Lin Landsman M.D.   On: 04/05/2014 18:51   Dg C-arm 1-60 Min-no Report  04/07/2014   CLINICAL DATA:  Postoperative imaging after right total hip arthroplasty  EXAM: DG C-ARM 1-60 MIN - NRPT [facility]; DG HIP W/ PELVIS 1V PORT*R*  COMPARISON:  04/05/2014  FINDINGS: Interval placement of right total hip arthroplasty noted with subcutaneous gas. No evidence for hardware failure. No fracture line visualized.  IMPRESSION:  Expected postoperative appearance after right total hip arthroplasty.   Electronically Signed   By: Conchita Paris M.D.   On: 04/07/2014 16:46   Dg Hip Port Unilat With Pelvis 1v Right  04/07/2014   CLINICAL DATA:  Postoperative imaging after right total hip arthroplasty  EXAM: DG C-ARM 1-60 MIN - [facility]; DG HIP W/ PELVIS 1V PORT*R*  COMPARISON:  04/05/2014  FINDINGS: Interval placement of right total hip arthroplasty noted with subcutaneous gas. No evidence for hardware failure. No fracture line visualized.  IMPRESSION: Expected postoperative appearance after right total hip arthroplasty.   Electronically Signed   By: Conchita Paris M.D.   On: 04/07/2014 16:46   Dg Hip Unilat With Pelvis 2-3 Views Right  04/05/2014   CLINICAL DATA:  Right hip pain for 1 year following a previous injury, recent fall with increasing hip pain  EXAM: DG HIP W/ PELVIS 2-3V*R*  COMPARISON:  None.  FINDINGS: The pelvic ring is intact. There are chronic changes involving the right proximal femur with prior fracture and breakdown of the femoral head and femoral neck. The femur is displaced superiorly along the superior lateral aspect of the acetabulum. Some dystrophic calcifications are seen. No gross soft tissue abnormality is noted.  IMPRESSION: Changes consistent with prior fracture with significant remodeling of the proximal femur and superior subluxation. No definitive acute fracture is identified on this exam.   Electronically Signed   By: Inez Catalina M.D.   On: 04/05/2014 14:08    ASSESSMENT/PLAN:   Generalized weakness - for rehabilitation Osteoarthritis S/P right total hip arthroplasty - pain is well controlled,  continue Norco 7.5/325 mg 1-2 tabs by mouth every 4 hours when necessary and Ultram 50 mg 1 tab by mouth every 6 hours when necessary for pain; Zanaflex 4 mg by mouth every 6 hours when necessary for muscle spasm; aspirin 325 mg by mouth twice a day and bilateral TED hose 2 weeks for DVT  prophylaxis Anemia, acute blood loss - hemoglobin 10.5, stable; continue ferrous sulfate 325 mg by mouth 3 times a day Edema, bilateral lower extremity - continue Lasix 20 mg by mouth daily Constipation  - start senna S2 tabs by mouth twice a day and MiraLAX 17 g +4-6 ounces liquid by mouth twice a day   Goals of care:  Short-term rehabilitation    Labs/test ordered:  CBC, CMP   Spent 50 minutes in patient care.    [facility], NP [employer] [phone]

## 2014-04-13 ENCOUNTER — Non-Acute Institutional Stay (SKILLED_NURSING_FACILITY): Payer: Medicare Other | Admitting: Internal Medicine

## 2014-04-13 DIAGNOSIS — M810 Age-related osteoporosis without current pathological fracture: Secondary | ICD-10-CM

## 2014-04-13 DIAGNOSIS — R531 Weakness: Secondary | ICD-10-CM

## 2014-04-13 DIAGNOSIS — R600 Localized edema: Secondary | ICD-10-CM

## 2014-04-13 DIAGNOSIS — D62 Acute posthemorrhagic anemia: Secondary | ICD-10-CM

## 2014-04-13 DIAGNOSIS — D72829 Elevated white blood cell count, unspecified: Secondary | ICD-10-CM

## 2014-04-13 DIAGNOSIS — K5900 Constipation, unspecified: Secondary | ICD-10-CM

## 2014-04-13 DIAGNOSIS — M1611 Unilateral primary osteoarthritis, right hip: Secondary | ICD-10-CM

## 2014-04-13 NOTE — Progress Notes (Signed)
Patient ID: [name], male   DOB: 1924/10/29, 81 y.o.   MRN: [id number]     [facility]   PCP: Jerlyn Ly, MD  No Known Allergies  Chief Complaint  Patient presents with  . New Admit To SNF     HPI:  81 year old patient is here for short term rehabilitation post hospital admission from 04/05/14-04/09/14 with right hip pain suspected to be from osteoarthritis. Right hip x-ray showed finding consistent with prior fracture with significant remodeling of proximal femur and superior subluxation. He underwent right total hip replacement on 1/26. He has pmh of osteoporosis and anemia. He is seen in his room today. He complaints of not having a bowel movement since surgery. He is passing flatus. He does not feel like eating anything until he has a bowel movement. Denies nausea or vomiting. Denies abdominal pain but feels his abdomen is distended. His pain is under control. No others concerns/ As per staff, pt had a small bowel movement 2 days back in the facility.  Review of Systems:  Constitutional: Negative for fever, chills, malaise/fatigue and diaphoresis.  HENT: Negative for headache, congestion Eyes: Negative for eye pain, blurred vision, double vision and discharge.  Respiratory: Negative for cough, shortness of breath and wheezing.   Cardiovascular: Negative for chest pain, palpitations, leg swelling.  Gastrointestinal: Negative for heartburn, nausea, vomiting, abdominal pain Genitourinary: Negative for dysuria, flank pain.  Musculoskeletal: Negative for back pain, falls Skin: Negative for itching, rash.  Neurological: Negative for dizziness, tingling, focal weakness Psychiatric/Behavioral: Negative for depression   Past Medical History  Diagnosis Date  . Pneumonia   . Osteomyelitis of toe of left foot 06/07/2011  . MRSA (methicillin resistant staph aureus) culture positive 06/07/2011  . Osteoporosis   . Coronary artery disease   .  Heart murmur    Past Surgical History  Procedure Laterality Date  . Left  rotator cuff repair    . Tonsillectomy    . Amputation  06/05/2011    Procedure: AMPUTATION DIGIT;  Surgeon: Wylene Simmer, MD;  Location: [facility];  Service: Orthopedics;  Laterality: Left;  Left hallux amputation  . Total hip arthroplasty Right 04/07/2014    Procedure: TOTAL HIP ARTHROPLASTY ANTERIOR APPROACH;  Surgeon: Mauri Pole, MD;  Location: [facility];  Service: Orthopedics;  Laterality: Right;   Social History:   reports that he quit smoking about 2 years ago. His smoking use included Cigars. He has never used smokeless tobacco. He reports that he does not drink alcohol or use illicit drugs.  Family History  Problem Relation Age of Onset  . Prostate cancer Son   . Cancer Father     Medications: Patient's Medications  New Prescriptions   No medications on file  Previous Medications   ASPIRIN 325 MG EC TABLET    Take 1 tablet (325 mg total) by mouth 2 (two) times daily.   CALCIUM CARBONATE (CALCIUM 600 PO)    Take 1 tablet by mouth daily.   CELECOXIB (CELEBREX) 200 MG CAPSULE    Take 1 capsule (200 mg total) by mouth every 12 (twelve) hours.   CHLORHEXIDINE GLUCONATE 4 % SOLN    Apply 1 application topically daily. Apply head to toe body wash daily 5 days prior to surgery   DOCUSATE SODIUM (COLACE) 100 MG CAPSULE    Take 1 capsule (100 mg total) by mouth 2 (two) times daily.   FEEDING SUPPLEMENT, ENSURE COMPLETE, (ENSURE COMPLETE) LIQD  Take 237 mLs by mouth 2 (two) times daily between meals.   FERROUS SULFATE 325 (65 FE) MG TABLET    Take 1 tablet (325 mg total) by mouth 3 (three) times daily after meals.   FUROSEMIDE (LASIX) 20 MG TABLET    Take 1 tablet by mouth daily.   HYDROCODONE-ACETAMINOPHEN (NORCO) 7.5-325 MG PER TABLET    Take 1-2 tablets by mouth every 4 (four) hours as needed for moderate pain.   MULTIPLE VITAMINS-MINERALS (MULTIVITAMINS THER. W/MINERALS) TABS    Take 1 tablet by mouth daily.    MULTIVITAMIN (PROSIGHT) TABS    Take 1 tablet by mouth daily.   MUPIROCIN OINTMENT (BACTROBAN) 2 %    Place 1 application into the nose 3 (three) times daily. 5 days prior to surgery   OMEGA-3 FATTY ACIDS (FISH OIL) 1000 MG CAPS    Take 1 capsule by mouth daily.   POLYETHYLENE GLYCOL (MIRALAX / GLYCOLAX) PACKET    Take 17 g by mouth daily as needed for mild constipation.   TIZANIDINE (ZANAFLEX) 4 MG TABLET    Take 1 tablet (4 mg total) by mouth every 6 (six) hours as needed for muscle spasms.   TRAMADOL (ULTRAM) 50 MG TABLET    Take 1 tablet (50 mg total) by mouth every 6 (six) hours as needed.   VITAMIN B-12 (CYANOCOBALAMIN) 500 MCG TABLET    Take 500 mcg by mouth daily.  Modified Medications   No medications on file  Discontinued Medications   No medications on file     Physical Exam: Filed Vitals:   04/13/14 1949  BP: 111/77  Pulse: 89  Temp: 98 F (36.7 C)  Resp: 16  SpO2: 95%    General- elderly male, well built, in no acute distress Head- normocephalic, atraumatic Throat- moist mucus membrane Cardiovascular- normal s1,s2, no murmurs, palpable dorsalis pedis and radial pulses,trace leg edema Respiratory- bilateral clear to auscultation, no wheeze, no rhonchi, no crackles, no use of accessory muscles Abdomen- bowel sounds present, soft, non tender, distended, no guarding or rigidity, no CVA tenderness Musculoskeletal- able to move all 4 extremities, generaloized weakness with left hip limited range of motion, amputated left toe  Neurological- no focal deficit Skin- warm and dry, right hip surgical incision healing well Psychiatry- alert and oriented, normal mood and affect    Labs reviewed: Basic Metabolic Panel:  Recent Labs  04/06/14 0505 04/08/14 0446 04/09/14 0445  NA 140 135 139  K 4.6 4.8 5.2*  CL 105 102 104  CO2 27 26 25   GLUCOSE 88 156* 129*  BUN 16 19 36*  CREATININE 0.85 0.74 0.97  CALCIUM 8.1* 7.6* 8.2*   CBC:  Recent Labs  04/05/14 1329  04/06/14 0505 04/08/14 0446 04/09/14 0445  WBC 12.0* 9.6 10.0 13.1*  NEUTROABS 10.1*  --   --   --   HGB 11.8* 11.3* 10.3* 10.5*  HCT 36.0* 34.9* 32.0* 32.8*  MCV 94.0 94.8 94.4 94.8  PLT 303 264 312 346   Assessment/Plan  Osteoarthritis right hip S/P right total hip arthroplasty. Pain under control. Continue Norco 7.5/325 mg 1-2 tabs every 4 hours when necessary and Ultram 50 mg 1 tab every 6 hours when necessary for pain. continue Zanaflex 4 mg every 6 hours when necessary for muscle spasm. Continue aspirin 325 mg twice a day for dvt prophylaxis. Has f/u with orthopedics. Will have patient work with PT/OT as tolerated to regain strength and restore function.  Fall precautions are in place. To wear  ted hose.  Constipation Recently started on senna s 2 tab bid and miralax bid. provided milk of magnesia and prune juice at lunch time and patient has now had a bowel movement. Continue current regimen  Anemia acute blood loss. continue ferrous sulfate 325 mg tid and monitor h&h  Leukocytosis No signs of infection at present, possible stress response. Recheck cbc with diff in 1 week  Edema continue Lasix 20 mg daily  Generalized weakness Will have him work with physical therapy and occupational therapy team to help with gait training and muscle strengthening exercises.fall precautions. Skin care. Encourage to be out of bed. Continue nutritional supplement and b12.  Osteoporosis Fall precautions. Continue calcium supplement.  Goals of care: short term rehabilitation   Labs/tests ordered: cbc with diff, bmp in 1 week  Family/ staff Communication: reviewed care plan with patient and nursing supervisor    Blanchie Serve, MD  [place] [phone] (Monday-Friday 8 am - 5 pm) [phone] (afterhours)

## 2014-05-05 ENCOUNTER — Inpatient Hospital Stay (HOSPITAL_COMMUNITY): Admission: RE | Admit: 2014-05-05 | Payer: Medicare Other | Source: Ambulatory Visit | Admitting: Orthopedic Surgery

## 2014-05-05 ENCOUNTER — Encounter (HOSPITAL_COMMUNITY): Admission: RE | Payer: Self-pay | Source: Ambulatory Visit

## 2014-05-05 SURGERY — ARTHROPLASTY, HIP, TOTAL, ANTERIOR APPROACH
Anesthesia: Choice | Site: Hip | Laterality: Right

## 2014-05-20 ENCOUNTER — Encounter: Payer: Self-pay | Admitting: Adult Health

## 2014-05-20 ENCOUNTER — Non-Acute Institutional Stay (SKILLED_NURSING_FACILITY): Payer: Medicare Other | Admitting: Adult Health

## 2014-05-20 DIAGNOSIS — R531 Weakness: Secondary | ICD-10-CM | POA: Diagnosis not present

## 2014-05-20 DIAGNOSIS — M1611 Unilateral primary osteoarthritis, right hip: Secondary | ICD-10-CM | POA: Diagnosis not present

## 2014-05-20 DIAGNOSIS — G4700 Insomnia, unspecified: Secondary | ICD-10-CM

## 2014-05-20 DIAGNOSIS — M810 Age-related osteoporosis without current pathological fracture: Secondary | ICD-10-CM | POA: Diagnosis not present

## 2014-05-20 DIAGNOSIS — K5900 Constipation, unspecified: Secondary | ICD-10-CM | POA: Diagnosis not present

## 2014-05-20 DIAGNOSIS — R600 Localized edema: Secondary | ICD-10-CM

## 2014-05-20 DIAGNOSIS — D62 Acute posthemorrhagic anemia: Secondary | ICD-10-CM

## 2014-05-20 NOTE — Progress Notes (Signed)
Patient ID: [name], male   DOB: May 14, 1924, 81 y.o.   MRN: [id number]   05/20/2014  Facility:  Nursing Home Location:  [place] Room Number: 767-2 LEVEL OF CARE:  SNF (31)   Chief Complaint  Patient presents with  . Discharge Note    Generalized weakness, Osteoarthritis S/P right total hip arthroplasty, anemia, edema, osteoporosis and constipation    HISTORY OF PRESENT ILLNESS:  This is an 81 year old male who is for discharge home with Home health PT for endurance, OT for ADLs, CNA for showers and Nurse for wound care. DME: [employer] for mobility. He has been admitted to [facility] on 04/09/14 from [facility]. He has past medical history of osteomyelitis S/P Left toe amputation. He has been having chronic right hip pain X 4 years. He had followed-up with his orthopedic, Dr. Alvan Dame, and was scheduled for hip replacement in February. He had been having poor appetite and neighbors helping him get up even to the bathroom. He had fallen 2 days prior to hospitalization and had right total hip arthroplasty.   Patient was admitted to this facility for short-term rehabilitation after the patient's recent hospitalization.  Patient has completed SNF rehabilitation and therapy has cleared the patient for discharge.  PAST MEDICAL HISTORY:  Past Medical History  Diagnosis Date  . Pneumonia   . Osteomyelitis of toe of left foot 06/07/2011  . MRSA (methicillin resistant staph aureus) culture positive 06/07/2011  . Osteoporosis   . Coronary artery disease   . Heart murmur     CURRENT MEDICATIONS: Reviewed per MAR/see medication list  No Known Allergies   REVIEW OF SYSTEMS:  GENERAL: no change in appetite, no fatigue, no weight changes, no fever, chills or weakness RESPIRATORY: no cough, SOB, DOE, wheezing, hemoptysis CARDIAC: no chest pain, or palpitations GI: no abdominal pain, diarrhea, heart burn, nausea or vomiting,  +constipation  PHYSICAL EXAMINATION  GENERAL: no acute distress, normal body habitus SKIN:  Skin tear to left knee and right hip surgical site is dry, no redness NECK: supple, trachea midline, no neck masses, no thyroid tenderness, no thyromegaly LYMPHATICS: no LAN in the neck, no supraclavicular LAN RESPIRATORY: breathing is even & unlabored, BS CTAB CARDIAC: RRR, no murmur,no extra heart sounds, BLE edema 1+ GI: abdomen soft, normal BS, no masses, no tenderness, no hepatomegaly, no splenomegaly EXTREMITIES: Able to move 4 extremities PSYCHIATRIC: the patient is alert & oriented to person, affect & behavior appropriate  LABS/RADIOLOGY: 04/29/14  WBC 6.8 hemoglobin 10.3 hematocrit 31.8 MCV 95.2 04/20/14  WBC 8.0 hemoglobin 8.7 hematocrit 26.8 MCV 93.4 sodium 138 potassium 3.9 glucose 88 BUN 11 creatinine 0.63 calcium 7.6 Labs reviewed: Basic Metabolic Panel:  Recent Labs  04/06/14 0505 04/08/14 0446 04/09/14 0445  NA 140 135 139  K 4.6 4.8 5.2*  CL 105 102 104  CO2 27 26 25   GLUCOSE 88 156* 129*  BUN 16 19 36*  CREATININE 0.85 0.74 0.97  CALCIUM 8.1* 7.6* 8.2*   CBC:  Recent Labs  04/05/14 1329 04/06/14 0505 04/08/14 0446 04/09/14 0445  WBC 12.0* 9.6 10.0 13.1*  NEUTROABS 10.1*  --   --   --   HGB 11.8* 11.3* 10.3* 10.5*  HCT 36.0* 34.9* 32.0* 32.8*  MCV 94.0 94.8 94.4 94.8  PLT 303 264 312 346    ASSESSMENT/PLAN:   Generalized weakness - for home health PT, OT, Nurse and CNA Osteoarthritis S/P right total hip arthroplasty - continue Norco  7.5/325 mg 1-2 tabs by mouth every 4 hours when necessary and Ultram 50 mg 1 tab by mouth every 6 hours when necessary for pain; Zanaflex 4 mg by mouth every 6 hours when necessary for muscle spasm Anemia, acute blood loss - hemoglobin 10.3, stable; continue ferrous sulfate 325 mg by mouth 2 times a day Edema, bilateral lower extremity - continue Lasix 20 mg by mouth BID and KCL 20 meq PO Q other day for K  supplementation Constipation  - continue senna S2 tabs by mouth twice a day and MiraLAX 17 g +4-6 ounces liquid by mouth twice a day Insomnia - continue Ambien 5 mg PO Q HS PRN   I have filled out patient's discharge paperwork and written prescriptions.  Patient will receive home health PT, OT, Nursing and CNA.  DME provided:  [employer]  Total discharge time: Greater than 30 minutes  Discharge time involved coordination of the discharge process with Education officer, museum, nursing staff and therapy department. Medical justification for home health services/DME verified.     [facility], NP [employer] [phone]

## 2015-11-15 IMAGING — DX DG CHEST 1V PORT
1 series · 1 of 1 positions shown · non-contrast
Comparison: None.

CLINICAL DATA: Weakness. Right hip deformity with planned possible
arthroplasty. Initial encounter.

EXAM:
PORTABLE CHEST - 1 VIEW

[AP]
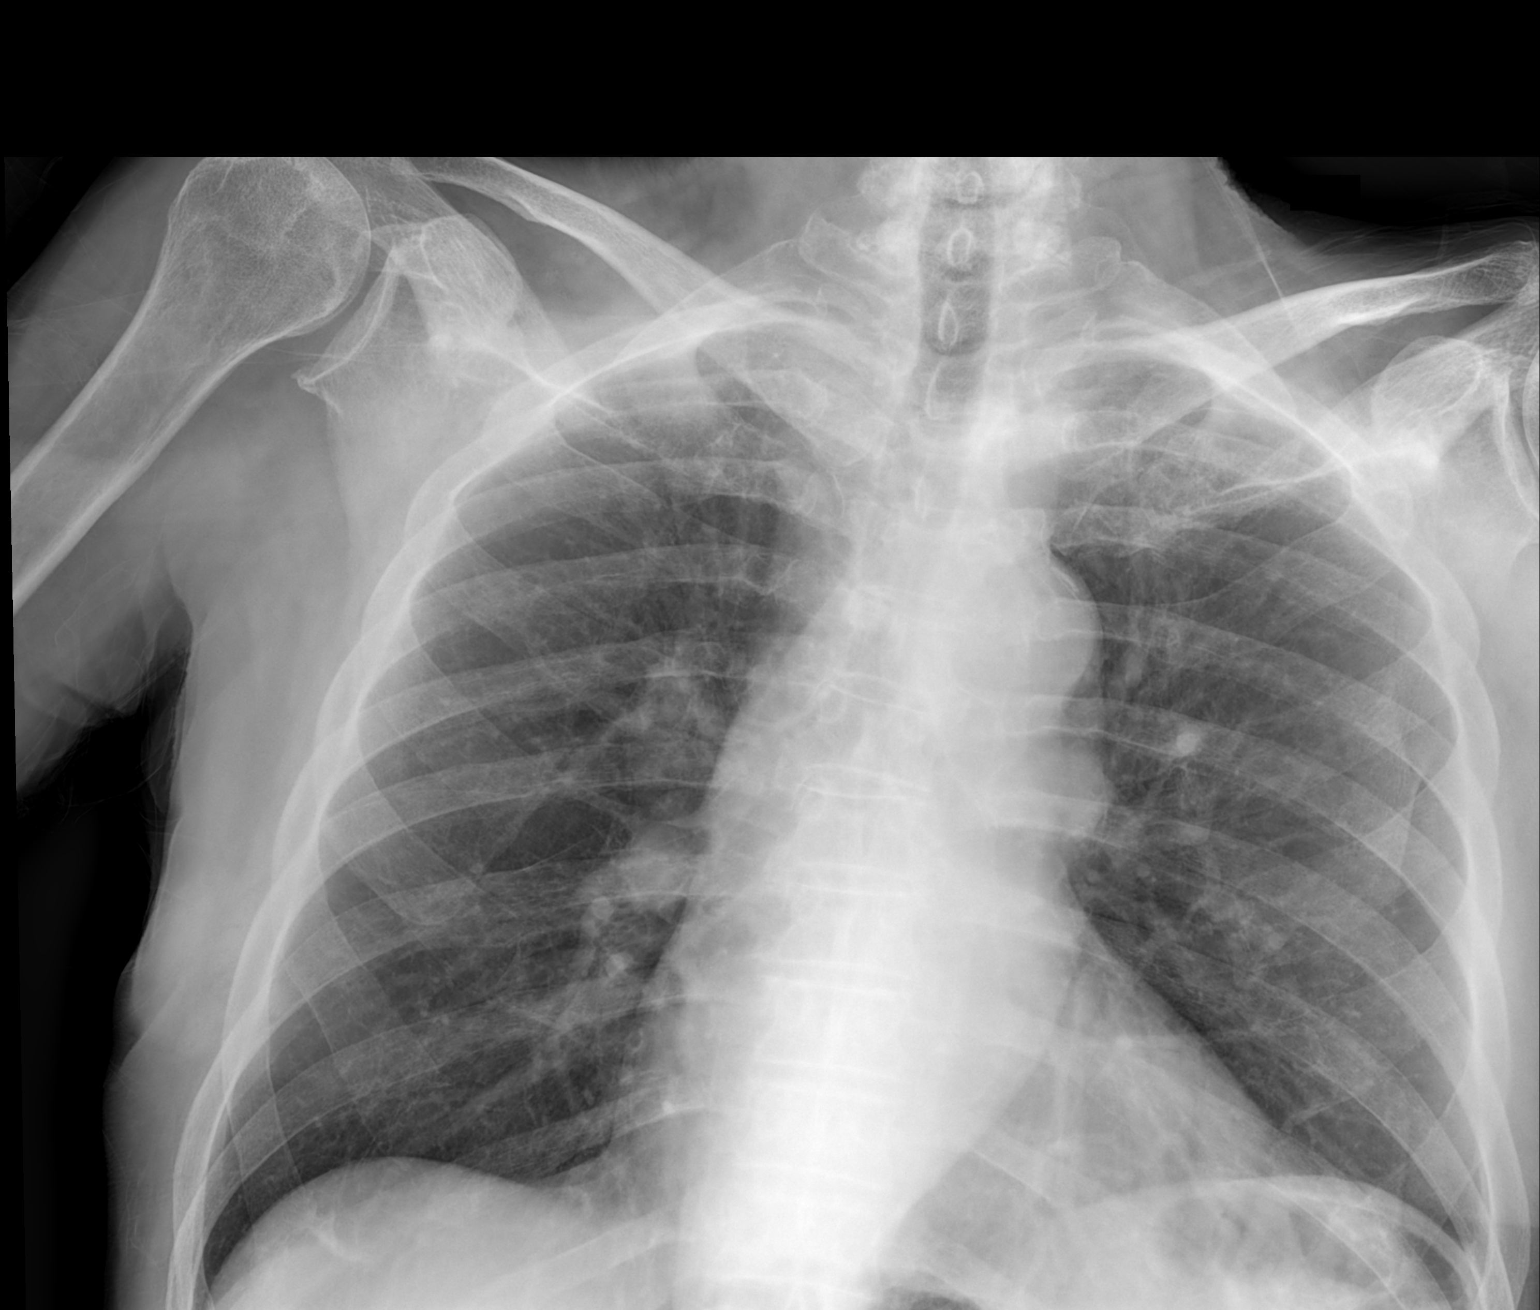

[1 of 1 positions shown; findings below may reference images not displayed]

FINDINGS: 7635 hr. The heart size and mediastinal contours are normal for age.
There is mild aortic tortuosity. The lungs are clear. There is no
pleural effusion or pneumothorax. No acute osseous findings are
evident. There are mild glenohumeral and lower cervical spine
degenerative changes.
IMPRESSION: No active cardiopulmonary process or acute osseous findings.

## 2015-11-15 IMAGING — CR DG HIP (WITH OR WITHOUT PELVIS) 2-3V*R*
3 series · 3 of 3 positions shown · non-contrast
Comparison: None.

CLINICAL DATA: Right hip pain for 1 year following a previous
injury, recent fall with increasing hip pain

EXAM:
DG HIP W/ PELVIS 2-3V*R*

[t pelvis ap]
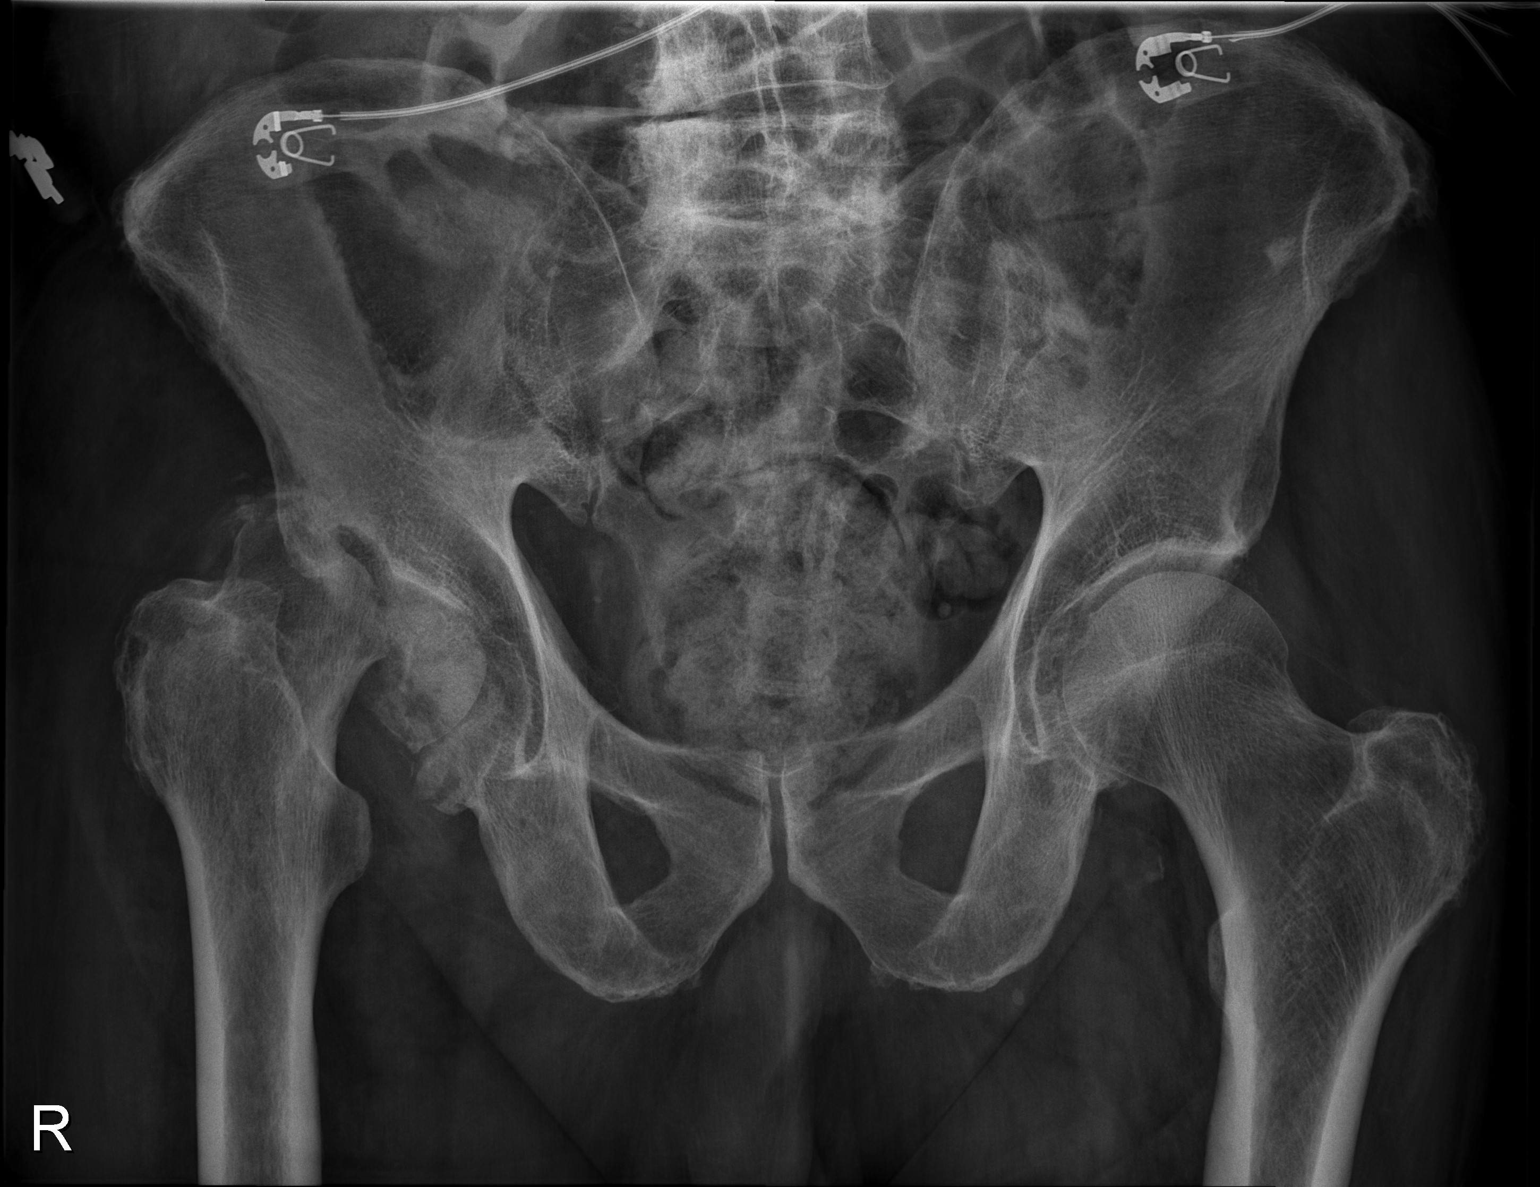

[t hip ap right]
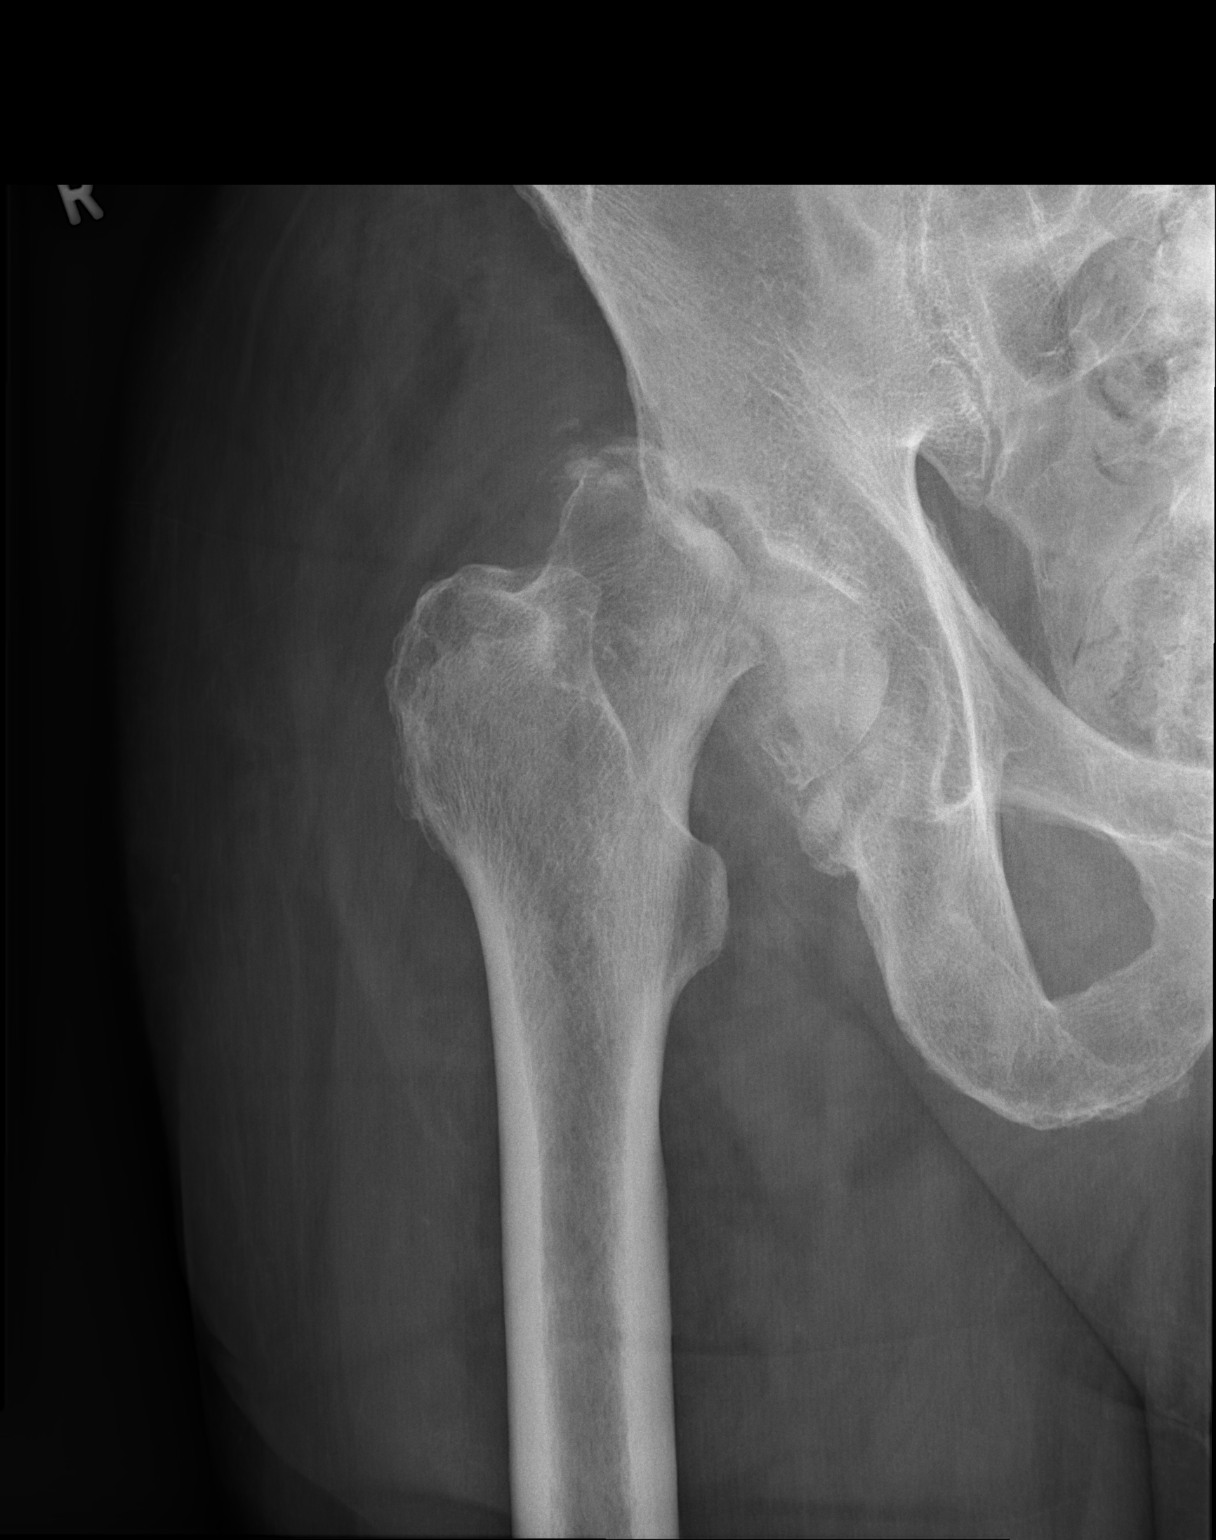

[w hip lat right]
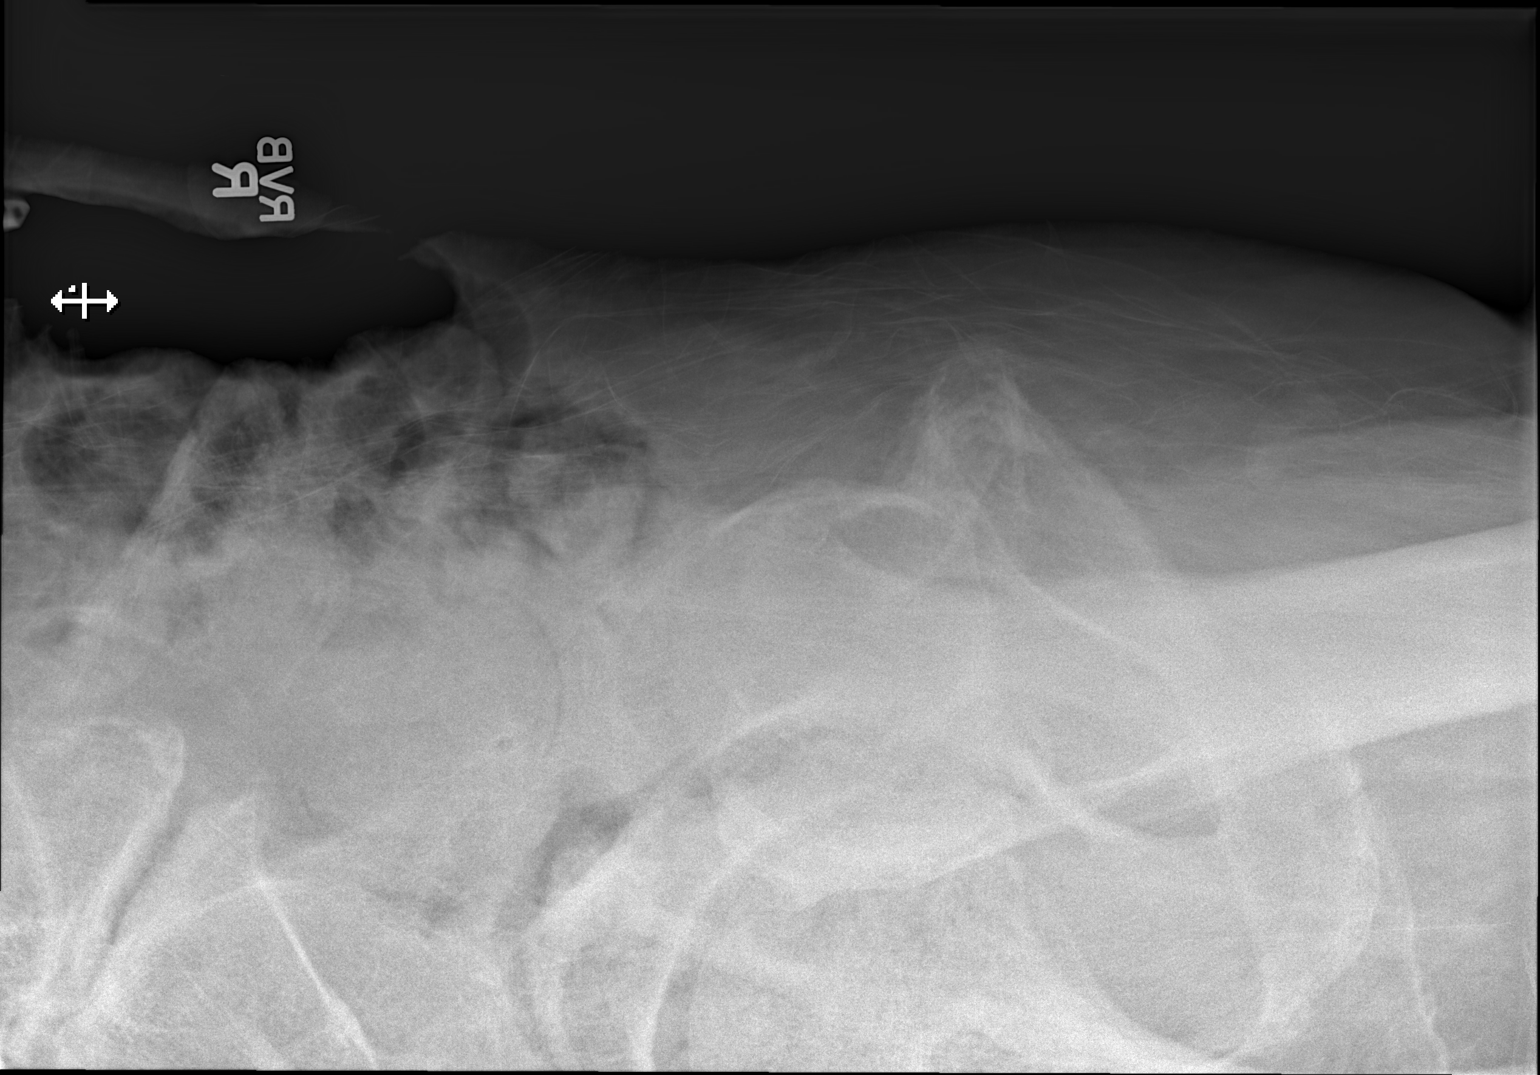

[3 of 3 positions shown; findings below may reference images not displayed]

FINDINGS: The pelvic ring is intact. There are chronic changes involving the
right proximal femur with prior fracture and breakdown of the
femoral head and femoral neck. The femur is displaced superiorly
along the superior lateral aspect of the acetabulum. Some dystrophic
calcifications are seen. No gross soft tissue abnormality is noted.
IMPRESSION: Changes consistent with prior fracture with significant remodeling
of the proximal femur and superior subluxation. No definitive acute
fracture is identified on this exam.

## 2017-02-26 ENCOUNTER — Encounter: Payer: Self-pay | Admitting: Hematology & Oncology

## 2017-02-26 ENCOUNTER — Other Ambulatory Visit: Payer: Self-pay

## 2017-02-26 ENCOUNTER — Other Ambulatory Visit (HOSPITAL_BASED_OUTPATIENT_CLINIC_OR_DEPARTMENT_OTHER): Payer: Medicare Other

## 2017-02-26 ENCOUNTER — Telehealth: Payer: Self-pay | Admitting: *Deleted

## 2017-02-26 ENCOUNTER — Ambulatory Visit (HOSPITAL_BASED_OUTPATIENT_CLINIC_OR_DEPARTMENT_OTHER): Payer: Medicare Other | Admitting: Hematology & Oncology

## 2017-02-26 VITALS — BP 125/81 | HR 90 | Temp 98.2°F | Resp 20 | Wt 179.0 lb

## 2017-02-26 DIAGNOSIS — D61818 Other pancytopenia: Secondary | ICD-10-CM | POA: Diagnosis not present

## 2017-02-26 DIAGNOSIS — D649 Anemia, unspecified: Secondary | ICD-10-CM

## 2017-02-26 LAB — TECHNOLOGIST REVIEW CHCC SATELLITE

## 2017-02-26 LAB — CBC WITH DIFFERENTIAL (CANCER CENTER ONLY)
BASO#: 0.1 10*3/uL (ref 0.0–0.2)
BASO%: 2.8 % — ABNORMAL HIGH (ref 0.0–2.0)
EOS ABS: 0 10*3/uL (ref 0.0–0.5)
EOS%: 1.7 % (ref 0.0–7.0)
HCT: 29.9 % — ABNORMAL LOW (ref 38.7–49.9)
HEMOGLOBIN: 9.7 g/dL — AB (ref 13.0–17.1)
LYMPH#: 1.3 10*3/uL (ref 0.9–3.3)
LYMPH%: 72.9 % — AB (ref 14.0–48.0)
MCH: 31.4 pg (ref 28.0–33.4)
MCHC: 32.4 g/dL (ref 32.0–35.9)
MCV: 97 fL (ref 82–98)
MONO#: 0.2 10*3/uL (ref 0.1–0.9)
MONO%: 8.8 % (ref 0.0–13.0)
NEUT#: 0.3 10*3/uL — CL (ref 1.5–6.5)
NEUT%: 13.8 % — ABNORMAL LOW (ref 40.0–80.0)
PLATELETS: 100 10*3/uL — AB (ref 145–400)
RBC: 3.09 10*6/uL — ABNORMAL LOW (ref 4.20–5.70)
RDW: 21.9 % — ABNORMAL HIGH (ref 11.1–15.7)
WBC: 1.8 10*3/uL — ABNORMAL LOW (ref 4.0–10.0)

## 2017-02-26 LAB — CMP (CANCER CENTER ONLY)
ALT(SGPT): 21 U/L (ref 10–47)
AST: 17 U/L (ref 11–38)
Albumin: 3.2 g/dL — ABNORMAL LOW (ref 3.3–5.5)
Alkaline Phosphatase: 64 U/L (ref 26–84)
BUN: 19 mg/dL (ref 7–22)
CHLORIDE: 104 meq/L (ref 98–108)
CO2: 29 meq/L (ref 18–33)
CREATININE: 1 mg/dL (ref 0.6–1.2)
Calcium: 9.3 mg/dL (ref 8.0–10.3)
Glucose, Bld: 99 mg/dL (ref 73–118)
Potassium: 4.1 mEq/L (ref 3.3–4.7)
SODIUM: 145 meq/L (ref 128–145)
Total Bilirubin: 0.6 mg/dl (ref 0.20–1.60)
Total Protein: 7.1 g/dL (ref 6.4–8.1)

## 2017-02-26 LAB — CHCC SATELLITE - SMEAR

## 2017-02-26 NOTE — Telephone Encounter (Signed)
Critical Value Neutrophils 0.3 Dr Ennever notified. No orders at this time 

## 2017-02-26 NOTE — Progress Notes (Signed)
Referral MD  Reason for Referral: Pancytopenia  Chief Complaint  Patient presents with  . New Patient (Initial Visit)  : I was told that my blood count is low.  HPI: [name] is an incredibly nice 83 year old white male.  He comes in with his son.  [name] is a World War II veteran.  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  He is followed by Dr. Crist Infante.  As always, Dr. Joylene Draft does a very thorough job.  Dr. Joylene Draft he recently did some routine lab work on him.  [name] was not having any specific symptoms.  He was having some increased bruising.  There is no fever.  There is no cough.  He had no weight loss.  There is no change in medications.  Dr. Joylene Draft found that he had a marked change in his CBC.  His white cell count was 1.6.  Hemoglobin 10.2.  Hematocrit 32.1.  Platelet count 99,000.  MCV was 98.  His white cell differential showed 17 segs 48 lymphs 19 monocytes.  Had normal thyroid.  Had a normal PSA.  His electrolytes were all pretty normal.  He subsequently referred [name] to the [facility] for an evaluation.  Again, [name] comes in with his son.  [name] does smoke cigars.  He has had no occupational exposures.  He worked for [employer].  He does not drink.  He has had past surgery.  He has not had a splenectomy.  There is been no rashes.  He has had some leg swelling.  He has had no new medications.  Currently, his performance status is ECOG 2.  His appetite is doing okay.  He is not a vegetarian.   Past Medical History:  Diagnosis Date  . Coronary artery disease   . Heart murmur   . MRSA (methicillin resistant staph aureus) culture positive 06/07/2011  . Osteomyelitis of toe of left foot ([place]) 06/07/2011  . Osteoporosis   . Pneumonia   :  Past Surgical History:  Procedure Laterality Date  . AMPUTATION  06/05/2011   Procedure: AMPUTATION DIGIT;  Surgeon: Wylene Simmer, MD;   Location: [facility];  Service: Orthopedics;  Laterality: Left;  Left hallux amputation  . Left  Rotator Cuff Repair    . TONSILLECTOMY    . TOTAL HIP ARTHROPLASTY Right 04/07/2014   Procedure: TOTAL HIP ARTHROPLASTY ANTERIOR APPROACH;  Surgeon: Mauri Pole, MD;  Location: [facility];  Service: Orthopedics;  Laterality: Right;  :   Current Outpatient Medications:  .  Calcium Carbonate (CALCIUM 600 PO), Take 1 tablet by mouth daily., Disp: , Rfl:  .  celecoxib (CELEBREX) 200 MG capsule, Take 1 capsule (200 mg total) by mouth every 12 (twelve) hours., Disp: , Rfl:  .  Chlorhexidine Gluconate 4 % SOLN, Apply 1 application topically daily. Apply head to toe body wash daily 5 days prior to surgery, Disp: 237 mL, Rfl: 0 .  docusate sodium (COLACE) 100 MG capsule, Take 1 capsule (100 mg total) by mouth 2 (two) times daily., Disp: 10 capsule, Rfl: 0 .  feeding supplement, ENSURE COMPLETE, (ENSURE COMPLETE) LIQD, Take 237 mLs by mouth 2 (two) times daily between meals., Disp: , Rfl:  .  ferrous sulfate 325 (65 FE) MG tablet, Take 1 tablet (325 mg total) by mouth 3 (three) times daily after meals., Disp: , Rfl: 3 .  furosemide (LASIX) 20 MG tablet,  Take 1 tablet by mouth daily., Disp: , Rfl: 0 .  HYDROcodone-acetaminophen (NORCO) 7.5-325 MG per tablet, Take 1-2 tablets by mouth every 4 (four) hours as needed for moderate pain., Disp: 80 tablet, Rfl: 0 .  levofloxacin (LEVAQUIN) 500 MG tablet, Take 500 mg by mouth., Disp: , Rfl: 0 .  Multiple Vitamins-Minerals (MULTIVITAMINS THER. W/MINERALS) TABS, Take 1 tablet by mouth daily., Disp: , Rfl:  .  multivitamin (PROSIGHT) TABS, Take 1 tablet by mouth daily., Disp: , Rfl:  .  mupirocin ointment (BACTROBAN) 2 %, Place 1 application into the nose 3 (three) times daily. 5 days prior to surgery, Disp: 22 g, Rfl: 0 .  Omega-3 Fatty Acids (FISH OIL) 1000 MG CAPS, Take 1 capsule by mouth daily., Disp: , Rfl:  .  polyethylene glycol (MIRALAX / GLYCOLAX) packet, Take 17 g  by mouth daily as needed for mild constipation., Disp: 14 each, Rfl: 0 .  potassium chloride SA (K-DUR,KLOR-CON) 20 MEQ tablet, Take 20 mEq by mouth daily., Disp: , Rfl: 3 .  tiZANidine (ZANAFLEX) 4 MG tablet, Take 1 tablet (4 mg total) by mouth every 6 (six) hours as needed for muscle spasms., Disp: 30 tablet, Rfl: 0 .  traMADol (ULTRAM) 50 MG tablet, Take 1 tablet (50 mg total) by mouth every 6 (six) hours as needed., Disp: 30 tablet, Rfl: 0 .  vitamin B-12 (CYANOCOBALAMIN) 500 MCG tablet, Take 500 mcg by mouth daily., Disp: , Rfl: :  :  No Known Allergies:  Family History  Problem Relation Age of Onset  . Prostate cancer Son   . Cancer Father   :  Social History   Socioeconomic History  . Marital status: Widowed    Spouse name: Not on file  . Number of children: 2  . Years of education: Not on file  . Highest education level: Not on file  Social Needs  . Financial resource strain: Not on file  . Food insecurity - worry: Not on file  . Food insecurity - inability: Not on file  . Transportation needs - medical: Not on file  . Transportation needs - non-medical: Not on file  Occupational History  . Not on file  Tobacco Use  . Smoking status: Former Smoker    Types: Cigars    Last attempt to quit: 06/02/2011    Years since quitting: 5.7  . Smokeless tobacco: Never Used  . Tobacco comment: Never inhaled  Substance and Sexual Activity  . Alcohol use: No  . Drug use: No  . Sexual activity: No  Other Topics Concern  . Not on file  Social History Narrative   Lives alone.  He has a purple heart from WWII and was injured in [place].  Of note his father also had a purple heart from WWII and was [place].   :  Pertinent items are noted in HPI.  Exam: Elderly white male in no obvious distress.  Vital signs show a temperature of 98.2.  Pulse 90.  Blood pressure 125/81.  Weight is 180 pounds.  Head neck exam shows no ocular or oral lesions.  There was no scleral icterus.   He has no obvious adenopathy in the neck.  Thyroid is nonpalpable.  Lungs are clear bilaterally.  Cardiac exam regular rate and rhythm with no murmurs, rubs or bruits.  Axillary exam does show some palpable axillary lymph nodes in the left axilla.  These are about 1 cm.  They are mobile and non-tender.  Abdomen is  soft.  He has good bowel sounds.  There is no fluid wave.  There is no palpable abdominal mass.  There is no palpable liver or spleen tip.  I cannot palpate any obvious inguinal adenopathy.  Back exam shows no tenderness over the spine, ribs or hips.  He does have some kyphosis.  Extremities shows some stasis dermatitis type changes in his lower legs.  He has some mild edema in his lower legs.  Skin exam shows some scattered bruises.  Neurological exam is nonfocal. _0 @   Recent Labs    02/26/17 1514  WBC 1.8*  HGB 9.7*  HCT 29.9*  PLT 100*   Recent Labs    02/26/17 1514  NA 145  K 4.1  CL 104  CO2 29  GLUCOSE 99  BUN 19  CREATININE 1.0  CALCIUM 9.3    Blood smear review: Normochromic and normocytic population of red blood cells.  I see no nucleated red blood cells.  He has no teardrop cells.  There are no inclusion bodies.  I see no rouleaux formation.  White blood cells are decreased in number.  He has the majority of lymphocytes.  There may be a couple cleaved lymphocytes.  I do not see any blasts.  There are no hypersegmented polys.  Platelets are slightly decreased in number.  Platelets are large.  Platelets are well granulated.  Pathology: None    Assessment and Plan: [name] is a 83 year old white male.  He has pancytopenia.  At least we have a timeframe for this.  A year ago his blood count was normal.  He does have some palpable adenopathy.  He may have lymphomatous involvement of his bone marrow.  However, I think this would be a little unusual.  I have to really worried about the possibility of an underlying myelodysplastic process or even leukemia.  I  think this would be more likely at his age.  I talked to he and his son for about an hour.  I reviewed his lab work.  I explained to him that the only way we could know what is going on and give him options as to how to improve would be a bone marrow biopsy.  I explained how a bone marrow biopsy is done.  I would do this in our office.  I think given his age, he should not have bones that are too sensitive.  I believe that a bone marrow biopsy will clearly give [place] the answer as to what is going on.  If he has myelodysplasia or acute leukemia, our options are very limited but given his performance status being halfway decent, we probably could use hypo-methylation therapy.  I suspect that we probably will have to consider him for transfusion support down the road.  Our goal here is quality of life.  I want to try to make sure that we focus on his quality of life so that he is functional and can enjoy his activity levels and do what he would like to do.  We will set up his bone marrow test for December 27.  I will probably get him back to see [place] in early January.  We will need to make sure that we send off his bone marrow for cytogenetics, flow cytometry, and FISH panel.  I think the best thing that we could be looking at would be lymphoma.

## 2017-02-27 LAB — VITAMIN B12: VITAMIN B 12: 740 pg/mL (ref 232–1245)

## 2017-02-27 LAB — IRON AND TIBC
%SAT: 47 % (ref 20–55)
IRON: 105 ug/dL (ref 42–163)
TIBC: 225 ug/dL (ref 202–409)
UIBC: 120 ug/dL (ref 117–376)

## 2017-02-27 LAB — LACTATE DEHYDROGENASE: LDH: 308 U/L — AB (ref 125–245)

## 2017-02-27 LAB — RETICULOCYTES: Reticulocyte Count: 1.2 % (ref 0.6–2.6)

## 2017-02-27 LAB — FERRITIN: FERRITIN: 259 ng/mL (ref 22–316)

## 2017-02-27 LAB — ERYTHROPOIETIN: ERYTHROPOIETIN: 106 m[IU]/mL — AB (ref 2.6–18.5)

## 2017-02-28 LAB — HGB FRAC. W/SOLUBILITY
HGB A2: 2 % (ref 1.8–3.2)
HGB A: 98 % (ref 96.4–98.8)
HGB C: 0 %
HGB F: 0 % (ref 0.0–2.0)
HGB SOLUBILITY: NEGATIVE
HGB Variant: 0 %
Hgb S: 0 %

## 2017-03-08 ENCOUNTER — Telehealth: Payer: Self-pay

## 2017-03-08 ENCOUNTER — Other Ambulatory Visit (HOSPITAL_COMMUNITY)
Admission: RE | Admit: 2017-03-08 | Discharge: 2017-03-08 | Disposition: A | Discharge status: Home / Self Care | Payer: Medicare Other | Source: Ambulatory Visit | Attending: Hematology & Oncology | Admitting: Hematology & Oncology

## 2017-03-08 ENCOUNTER — Ambulatory Visit (HOSPITAL_BASED_OUTPATIENT_CLINIC_OR_DEPARTMENT_OTHER): Payer: Medicare Other | Admitting: *Deleted

## 2017-03-08 ENCOUNTER — Other Ambulatory Visit (HOSPITAL_BASED_OUTPATIENT_CLINIC_OR_DEPARTMENT_OTHER): Payer: Medicare Other

## 2017-03-08 ENCOUNTER — Telehealth: Payer: Self-pay | Admitting: *Deleted

## 2017-03-08 VITALS — BP 124/84 | HR 87 | Temp 98.0°F

## 2017-03-08 DIAGNOSIS — C9200 Acute myeloblastic leukemia, not having achieved remission: Secondary | ICD-10-CM | POA: Insufficient documentation

## 2017-03-08 DIAGNOSIS — D61818 Other pancytopenia: Secondary | ICD-10-CM

## 2017-03-08 LAB — CBC WITH DIFFERENTIAL (CANCER CENTER ONLY)
BASO#: 0.1 10*3/uL (ref 0.0–0.2)
BASO%: 4 % — ABNORMAL HIGH (ref 0.0–2.0)
EOS ABS: 0 10*3/uL (ref 0.0–0.5)
EOS%: 2.1 % (ref 0.0–7.0)
HCT: 27.6 % — ABNORMAL LOW (ref 38.7–49.9)
HEMOGLOBIN: 9.1 g/dL — AB (ref 13.0–17.1)
LYMPH#: 1.1 10*3/uL (ref 0.9–3.3)
LYMPH%: 57.4 % — AB (ref 14.0–48.0)
MCH: 31.6 pg (ref 28.0–33.4)
MCHC: 33 g/dL (ref 32.0–35.9)
MCV: 96 fL (ref 82–98)
MONO#: 0.3 10*3/uL (ref 0.1–0.9)
MONO%: 16.8 % — AB (ref 0.0–13.0)
NEUT%: 19.7 % — ABNORMAL LOW (ref 40.0–80.0)
NEUTROS ABS: 0.4 10*3/uL — AB (ref 1.5–6.5)
Platelets: 91 10*3/uL — ABNORMAL LOW (ref 145–400)
RBC: 2.88 10*6/uL — ABNORMAL LOW (ref 4.20–5.70)
RDW: 22.5 % — ABNORMAL HIGH (ref 11.1–15.7)
WBC: 1.9 10*3/uL — ABNORMAL LOW (ref 4.0–10.0)

## 2017-03-08 LAB — CMP (CANCER CENTER ONLY)
ALBUMIN: 3.2 g/dL — AB (ref 3.3–5.5)
ALT(SGPT): 19 U/L (ref 10–47)
AST: 20 U/L (ref 11–38)
Alkaline Phosphatase: 68 U/L (ref 26–84)
BUN, Bld: 16 mg/dL (ref 7–22)
CHLORIDE: 102 meq/L (ref 98–108)
CO2: 30 meq/L (ref 18–33)
CREATININE: 1.1 mg/dL (ref 0.6–1.2)
Calcium: 9.2 mg/dL (ref 8.0–10.3)
Glucose, Bld: 105 mg/dL (ref 73–118)
POTASSIUM: 4.2 meq/L (ref 3.3–4.7)
SODIUM: 142 meq/L (ref 128–145)
Total Bilirubin: 1 mg/dl (ref 0.20–1.60)
Total Protein: 7.2 g/dL (ref 6.4–8.1)

## 2017-03-08 LAB — TECHNOLOGIST REVIEW CHCC SATELLITE

## 2017-03-08 NOTE — Telephone Encounter (Signed)
Critical Value Neutrophils 0.4 Dr Ennever notified. No orders at this time 

## 2017-03-08 NOTE — Telephone Encounter (Signed)
Patient in for Bone marrow biopsy and aspiration this am.  Consent obtained and procedure completed by Dr. Marin Olp without incident.  Patient tolerated well and pressure dressing applied left iliac crest, minimal bleeding.  Patient was discharged 20 minutes after procedudre completed with instructions to remove dressing tomorrow am and apply regular bandaid.  Dr. Marin Olp will be in touch when results return.  Patient l;eft via [place] with his daughter.

## 2017-03-08 NOTE — Addendum Note (Signed)
Addended by: Burney Gauze R on: 03/08/2017 06:05 PM   Modules accepted: Level of Service

## 2017-03-08 NOTE — Progress Notes (Signed)
This is a bone marrow biopsy and aspirate note for [name].  [name] was brought to the treatment room at the [facility].  He signed his consent form.  He did this at 7:55 AM.  We did a timeout procedure on him at 8 AM.  The bone marrow biopsy was being done to help evaluate his pancytopenia.  We placed him onto his right side.  The left posterior iliac crest region was prepped and draped in sterile fashion.  5 cc of 1% lidocaine was infiltrated under the skin and down to the periosteum.  A #11  scalpel was used to make an incision into the skin.  I use the combination Jamshidi biopsy/aspirate needle.  I obtained a good aspirate.  We also got a decent bone marrow biopsy core.  I dressed and cleaned the procedure site sterilely.  I dressed it appropriately.  He tolerated the procedure well.  There were no complications.  I spoke to his daughter afterwards.  This is a procedure note for [name].  Lattie Haw, MD

## 2017-03-09 ENCOUNTER — Telehealth: Payer: Self-pay | Admitting: *Deleted

## 2017-03-09 NOTE — Telephone Encounter (Signed)
Received call from Dr. Melina Copa pathologist.  Patient has preliminary diagnosis of acute leukemia. 38% blasts.  Dr. Marin Olp notified

## 2017-03-22 ENCOUNTER — Encounter (HOSPITAL_COMMUNITY): Payer: Self-pay

## 2017-03-26 ENCOUNTER — Telehealth: Payer: Self-pay | Admitting: *Deleted

## 2017-03-26 ENCOUNTER — Emergency Department (HOSPITAL_COMMUNITY): Payer: Medicare Other

## 2017-03-26 ENCOUNTER — Inpatient Hospital Stay: Payer: Medicare Other

## 2017-03-26 ENCOUNTER — Encounter (HOSPITAL_COMMUNITY): Payer: Self-pay

## 2017-03-26 ENCOUNTER — Emergency Department (HOSPITAL_COMMUNITY)
Admission: EM | Admit: 2017-03-26 | Discharge: 2017-04-13 | Disposition: E | Payer: Medicare Other | Attending: Emergency Medicine | Admitting: Emergency Medicine

## 2017-03-26 ENCOUNTER — Encounter (HOSPITAL_COMMUNITY): Payer: Self-pay | Admitting: Emergency Medicine

## 2017-03-26 ENCOUNTER — Inpatient Hospital Stay: Payer: Medicare Other | Admitting: Hematology & Oncology

## 2017-03-26 DIAGNOSIS — N189 Chronic kidney disease, unspecified: Secondary | ICD-10-CM | POA: Diagnosis not present

## 2017-03-26 DIAGNOSIS — Z96641 Presence of right artificial hip joint: Secondary | ICD-10-CM | POA: Diagnosis not present

## 2017-03-26 DIAGNOSIS — Z79899 Other long term (current) drug therapy: Secondary | ICD-10-CM | POA: Diagnosis not present

## 2017-03-26 DIAGNOSIS — M726 Necrotizing fasciitis: Secondary | ICD-10-CM | POA: Insufficient documentation

## 2017-03-26 DIAGNOSIS — I2510 Atherosclerotic heart disease of native coronary artery without angina pectoris: Secondary | ICD-10-CM | POA: Insufficient documentation

## 2017-03-26 DIAGNOSIS — C9200 Acute myeloblastic leukemia, not having achieved remission: Secondary | ICD-10-CM | POA: Insufficient documentation

## 2017-03-26 DIAGNOSIS — M545 Low back pain: Secondary | ICD-10-CM | POA: Diagnosis present

## 2017-03-26 DIAGNOSIS — Z87891 Personal history of nicotine dependence: Secondary | ICD-10-CM | POA: Diagnosis not present

## 2017-03-26 DIAGNOSIS — N289 Disorder of kidney and ureter, unspecified: Secondary | ICD-10-CM

## 2017-03-26 HISTORY — DX: Leukemia, unspecified not having achieved remission: C95.90

## 2017-03-26 LAB — COMPREHENSIVE METABOLIC PANEL
ALBUMIN: 2.3 g/dL — AB (ref 3.5–5.0)
ALK PHOS: 87 U/L (ref 38–126)
ALT: 26 U/L (ref 17–63)
ANION GAP: 20 — AB (ref 5–15)
AST: 83 U/L — AB (ref 15–41)
BILIRUBIN TOTAL: 0.8 mg/dL (ref 0.3–1.2)
BUN: 46 mg/dL — AB (ref 6–20)
CALCIUM: 8 mg/dL — AB (ref 8.9–10.3)
CO2: 13 mmol/L — ABNORMAL LOW (ref 22–32)
Chloride: 104 mmol/L (ref 101–111)
Creatinine, Ser: 2.01 mg/dL — ABNORMAL HIGH (ref 0.61–1.24)
GFR calc Af Amer: 32 mL/min — ABNORMAL LOW (ref >60)
GFR calc non Af Amer: 28 mL/min — ABNORMAL LOW (ref >60)
GLUCOSE: 47 mg/dL — AB (ref 65–99)
Potassium: 6.2 mmol/L — ABNORMAL HIGH (ref 3.5–5.1)
SODIUM: 137 mmol/L (ref 135–145)
TOTAL PROTEIN: 6.3 g/dL — AB (ref 6.5–8.1)

## 2017-03-26 LAB — CBC WITH DIFFERENTIAL/PLATELET
BASOS PCT: 5 %
Basophils Absolute: 0.2 10*3/uL — ABNORMAL HIGH (ref 0.0–0.1)
EOS ABS: 0 10*3/uL (ref 0.0–0.7)
Eosinophils Relative: 0 %
HCT: 24.7 % — ABNORMAL LOW (ref 39.0–52.0)
HEMOGLOBIN: 7.8 g/dL — AB (ref 13.0–17.0)
LYMPHS PCT: 29 %
Lymphs Abs: 1 10*3/uL (ref 0.7–4.0)
MCH: 30.2 pg (ref 26.0–34.0)
MCHC: 31.6 g/dL (ref 30.0–36.0)
MCV: 95.7 fL (ref 78.0–100.0)
MONO ABS: 0.9 10*3/uL (ref 0.1–1.0)
Monocytes Relative: 27 %
NRBC: 45 /100{WBCs} — AB
Neutro Abs: 1.2 10*3/uL — ABNORMAL LOW (ref 1.7–7.7)
Neutrophils Relative %: 39 %
PLATELETS: 61 10*3/uL — AB (ref 150–400)
RBC: 2.58 MIL/uL — ABNORMAL LOW (ref 4.22–5.81)
RDW: 24.9 % — ABNORMAL HIGH (ref 11.5–15.5)
WBC: 3.3 10*3/uL — ABNORMAL LOW (ref 4.0–10.5)

## 2017-03-26 LAB — CBG MONITORING, ED: Glucose-Capillary: 33 mg/dL — CL (ref 65–99)

## 2017-03-26 MED ORDER — SODIUM CHLORIDE 0.9 % IV BOLUS (SEPSIS)
500.0000 mL | Freq: Once | INTRAVENOUS | Status: AC
  Administered 2017-03-26: 500 mL via INTRAVENOUS

## 2017-03-26 MED ORDER — MORPHINE SULFATE (PF) 4 MG/ML IV SOLN
4.0000 mg | Freq: Once | INTRAVENOUS | Status: AC
  Administered 2017-03-26: 4 mg via INTRAVENOUS
  Filled 2017-03-26: qty 1

## 2017-03-26 MED ORDER — DEXTROSE 50 % IV SOLN
INTRAVENOUS | Status: AC
  Filled 2017-03-26: qty 50

## 2017-03-26 MED ORDER — DEXTROSE-NACL 5-0.45 % IV SOLN
INTRAVENOUS | Status: DC
  Administered 2017-03-26: 17:00:00 via INTRAVENOUS

## 2017-03-26 MED ORDER — DEXTROSE 50 % IV SOLN
1.0000 | Freq: Once | INTRAVENOUS | Status: AC
  Administered 2017-03-26: 50 mL via INTRAVENOUS
  Filled 2017-03-26: qty 50

## 2017-03-27 ENCOUNTER — Encounter: Payer: Self-pay | Admitting: Internal Medicine

## 2017-03-27 LAB — CHROMOSOME ANALYSIS, BONE MARROW

## 2017-03-27 LAB — TISSUE HYBRIDIZATION (BONE MARROW)-NCBH

## 2017-03-28 ENCOUNTER — Encounter (HOSPITAL_COMMUNITY): Payer: Self-pay

## 2017-04-13 NOTE — ED Notes (Signed)
Bed: [facility]24 Expected date:  Expected time:  Means of arrival:  Comments: Hold for hall B

## 2017-04-13 NOTE — ED Notes (Signed)
Time of death 17

## 2017-04-13 NOTE — ED Triage Notes (Addendum)
Per EMS-states back pain that started 2 weeks ago-no trauma, no injury-possible stress fracture-lumbar pain radiating down both legs-states recently diagnosed with leukemia-50 mcg of Fentanyl given in route-History of hypotension BP 92/68

## 2017-04-13 NOTE — Progress Notes (Signed)
   2017-03-31 1800  Clinical Encounter Type  Visited With Family  Visit Type ED;Death  Referral From Chaplain;Nurse  Spiritual Encounters  Spiritual Needs Prayer;Grief support;Emotional  Stress Factors  Patient Stress Factors None identified  Family Stress Factors Loss   Name: [name]  Location : ED 24  Time: 5:40pm  Nature of call: End of Life  Chaplain was asked to come in to give emotional support to son who was at bedside. Pt had passed away by the time Chaplain arrived. Chaplain introduced herself and sat with son 68mins-ish. 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 Son reports that Pt was a man of faith. Chaplain shared a prayer with son and provided emotional support via empathy listening.

## 2017-04-13 NOTE — ED Notes (Signed)
Bed: [facility] Expected date:  Expected time:  Means of arrival:  Comments: 

## 2017-04-13 NOTE — ED Notes (Signed)
Patient transported to X-ray 

## 2017-04-13 NOTE — ED Notes (Addendum)
3rd amp D50 given verbal order EDP

## 2017-04-13 NOTE — ED Triage Notes (Addendum)
Patient brought in by EMS with possible stress fracture, with pain radiating down bilateral legs. Patient tachypnic and hypoxic in triage- 2LNC placed to patient. Audible expiratory wheezing noted. Patient reportedly hypotensive in EMS- patient BP stable in triage. Patient reports extreme pain 10/10 to bilateral legs. Patients son states the patient was diagnosed 10 days ago with acute leukemia- patient was supposed to follow up with oncologist today to discuss treatment. Patient's son states the patient was unable to bear weight due to his pain this morning.

## 2017-04-13 NOTE — ED Notes (Signed)
2nd amp D50 given, verbal order EDP

## 2017-04-13 NOTE — Telephone Encounter (Signed)
Received a call from patient son Ronalee Belts stating that patient will not be able to make the appointment as he cannot get his father to standing position due to weakness and pain.  Called Ronalee Belts back after consulting with Dr. Marin Olp.  Left voice mail on personal voicemail that advice would be to take patient to the ED.  Waiting for return call from patient son.

## 2017-04-13 NOTE — ED Notes (Signed)
Found alert necklace and hearing aid during post mortem care. Belongings placed in body bag. Son called and informed.

## 2017-04-13 NOTE — ED Provider Notes (Signed)
[facility] DEPT Provider Note   CSN: [id number] Arrival date & time: 2017/04/08  1353     History   Chief Complaint Chief Complaint  Patient presents with  . Back Pain  . Shortness of Breath    HPI [name] is a 84 y.o. male.  HPI Level 5 caveat due to altered mental status and acute pain.   Has been seen by oncology and had a bone marrow biopsy.  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  Patient was supposed to be seen in the office at 2:00 today to see the results.  Patient is now developed pain in both of his thighs.  Has had back pain previous to this.  There was thought of a compression fracture.  No fevers.  Patient tachypnea and hypoxic upon arrival.  Severe pain and left thighs and unable to ambulate.  Denies chest pain. Past Medical History:  Diagnosis Date  . Coronary artery disease   . Heart murmur   . Leukemia ([place])   . MRSA (methicillin resistant staph aureus) culture positive 06/07/2011  . Osteomyelitis of toe of left foot ([place]) 06/07/2011  . Osteoporosis   . Pneumonia     Patient Active Problem List   Diagnosis Date Noted  . Other iron deficiency anemias   . S/P right THA, AA 04/07/2014  . Generalized weakness 04/05/2014  . Anemia 04/05/2014  . Right hip pain 04/05/2014  . Preop cardiovascular exam 03/23/2014  . Murmur 04/22/2013  . Cellulitis 06/07/2011  . Hypokalemia 06/07/2011  . Hyponatremia 06/07/2011  . MRSA (methicillin resistant staph aureus) culture positive 06/07/2011    Past Surgical History:  Procedure Laterality Date  . AMPUTATION  06/05/2011   Procedure: AMPUTATION DIGIT;  Surgeon: Wylene Simmer, MD;  Location: [facility];  Service: Orthopedics;  Laterality: Left;  Left hallux amputation  . Left  Rotator Cuff Repair    . TONSILLECTOMY    . TOTAL HIP ARTHROPLASTY Right 04/07/2014   Procedure: TOTAL HIP ARTHROPLASTY ANTERIOR APPROACH;  Surgeon: Mauri Pole,  MD;  Location: [facility];  Service: Orthopedics;  Laterality: Right;       Home Medications    Prior to Admission medications   Medication Sig Start Date End Date Taking? Authorizing Provider  Calcium Carbonate (CALCIUM 600 PO) Take 1 tablet by mouth daily.   Yes [provider]  celecoxib (CELEBREX) 200 MG capsule Take 1 capsule (200 mg total) by mouth every 12 (twelve) hours. 04/09/14  Yes Debbe Odea, MD  Chlorhexidine Gluconate 4 % SOLN Apply 1 application topically daily. Apply head to toe body wash daily 5 days prior to surgery 03/17/14  Yes Carlyle Basques, MD  docusate sodium (COLACE) 100 MG capsule Take 1 capsule (100 mg total) by mouth 2 (two) times daily. 04/09/14  Yes Debbe Odea, MD  feeding supplement, ENSURE COMPLETE, (ENSURE COMPLETE) LIQD Take 237 mLs by mouth 2 (two) times daily between meals. 04/09/14  Yes Debbe Odea, MD  ferrous sulfate 325 (65 FE) MG tablet Take 1 tablet (325 mg total) by mouth 3 (three) times daily after meals. 04/09/14  Yes Debbe Odea, MD  furosemide (LASIX) 20 MG tablet Take 20 mg by mouth daily.  02/12/14  Yes [provider]  HYDROcodone-acetaminophen (NORCO) 7.5-325 MG per tablet Take 1-2 tablets by mouth every 4 (four) hours as needed for moderate pain. 04/09/14  Yes Danae Orleans, PA-C  Multiple Vitamins-Minerals (MULTIVITAMINS THER.  W/MINERALS) TABS Take 1 tablet by mouth daily.   Yes [provider]  multivitamin (PROSIGHT) TABS Take 1 tablet by mouth daily.   Yes [provider]  mupirocin ointment (BACTROBAN) 2 % Place 1 application into the nose 3 (three) times daily. 5 days prior to surgery 03/17/14  Yes Carlyle Basques, MD  Omega-3 Fatty Acids (FISH OIL) 1000 MG CAPS Take 1 capsule by mouth daily.   Yes [provider]  polyethylene glycol (MIRALAX / GLYCOLAX) packet Take 17 g by mouth daily as needed for mild constipation. 04/09/14  Yes Debbe Odea, MD  potassium chloride SA (K-DUR,KLOR-CON) 20  MEQ tablet Take 20 mEq by mouth daily. 01/31/17  Yes [provider]  tiZANidine (ZANAFLEX) 4 MG tablet Take 1 tablet (4 mg total) by mouth every 6 (six) hours as needed for muscle spasms. 04/09/14  Yes Babish, Rodman Key, PA-C  vitamin B-12 (CYANOCOBALAMIN) 500 MCG tablet Take 500 mcg by mouth daily.   Yes [provider]  levofloxacin (LEVAQUIN) 500 MG tablet Take 500 mg by mouth. 02/23/17   [provider]  traMADol (ULTRAM) 50 MG tablet Take 1 tablet (50 mg total) by mouth every 6 (six) hours as needed. Patient not taking: Reported on 2017/04/01 04/09/14   Debbe Odea, MD    Family History Family History  Problem Relation Age of Onset  . Cancer Father   . Prostate cancer Son     Social History Social History   Tobacco Use  . Smoking status: Former Smoker    Types: Cigars    Last attempt to quit: 06/02/2011    Years since quitting: 5.8  . Smokeless tobacco: Never Used  . Tobacco comment: Never inhaled  Substance Use Topics  . Alcohol use: No  . Drug use: No     Allergies   Patient has no known allergies.   Review of Systems Review of Systems  Unable to perform ROS: Mental status change     Physical Exam Updated Vital Signs BP (!) 82/51 (BP Location: Left Arm)   Pulse 96   Temp 97.8 F (36.6 C)   Resp (!) 29   Wt 81.2 kg (179 lb)   SpO2 91%   BMI 25.68 kg/m   Physical Exam  Constitutional: He appears well-developed.  HENT:  Head: Atraumatic.  Cardiovascular:  Tachycardia  Pulmonary/Chest:  Tachypnea with mildly harsh breath sounds.  Abdominal: There is no tenderness.  Musculoskeletal:  Edema bilateral lower extremities.  Bruising on bilateral lower legs also.  No erythema.  No lumbar tenderness.  Neurological:  Awake and mildly confused.  Mostly complaining of pain in his thighs.     ED Treatments / Results  Labs (all labs ordered are listed, but only abnormal results are displayed) Labs Reviewed  COMPREHENSIVE METABOLIC  PANEL - Abnormal; Notable for the following components:      Result Value   Potassium 6.2 (*)    CO2 13 (*)    Glucose, Bld 47 (*)    BUN 46 (*)    Creatinine, Ser 2.01 (*)    Calcium 8.0 (*)    Total Protein 6.3 (*)    Albumin 2.3 (*)    AST 83 (*)    GFR calc non Af Amer 28 (*)    GFR calc Af Amer 32 (*)    Anion gap 20 (*)    All other components within normal limits  CBC WITH DIFFERENTIAL/PLATELET - Abnormal; Notable for the following components:   WBC 3.3 (*)  RBC 2.58 (*)    Hemoglobin 7.8 (*)    HCT 24.7 (*)    RDW 24.9 (*)    Platelets 61 (*)    nRBC 45 (*)    Neutro Abs 1.2 (*)    Basophils Absolute 0.2 (*)    All other components within normal limits  CBG MONITORING, ED - Abnormal; Notable for the following components:   Glucose-Capillary 33 (*)    All other components within normal limits    EKG  EKG Interpretation None       Radiology Dg Chest 2 View  Result Date: March 27, 2017 CLINICAL DATA:  Shortness of breath. EXAM: CHEST  2 VIEW COMPARISON:  04/05/2014 FINDINGS: Low lung volumes. Cardiopericardial silhouette is at upper limits of normal for size. Mild vascular congestion without edema or focal airspace consolidation. No substantial pleural effusion. The visualized bony structures of the thorax are intact. Telemetry leads overlie the chest. IMPRESSION: Low volume film without acute cardiopulmonary findings. Electronically Signed   By: Misty Stanley M.D.   On: 03-27-2017 16:19   Dg Lumbar Spine Complete  Result Date: 03-27-17 CLINICAL DATA:  Shortness of breath.  Poor historian. EXAM: LUMBAR SPINE - COMPLETE 4+ VIEW COMPARISON:  None. FINDINGS: Mild grade 1 retrolisthesis of L2-3, L3-4 and L4-5 is noted secondary to moderate degenerative disc disease at these levels. Anterior osteophyte formation is noted at these levels as well. Severe compression deformity of T12 vertebral body is noted consistent with fracture of indeterminate age. Mildly dilated small  bowel loops are noted concerning for distal small bowel obstruction. Soft tissue gas is noted in gluteal region. IMPRESSION: Severe compression deformity of T12 vertebral body is noted of indeterminate age. If there is clinical concern for acute fracture, MRI may be performed for further evaluation. Moderate multilevel degenerative disc disease is noted. Mildly dilated small bowel loops are noted concerning for distal small bowel obstruction. Gas is noted in the gluteal soft tissues concerning for infection. CT scan of the abdomen and pelvis is recommended for further evaluation. Electronically Signed   By: Marijo Conception, M.D.   On: Mar 27, 2017 16:26    Procedures Procedures (including critical care time)  Medications Ordered in ED Medications  dextrose 5 %-0.45 % sodium chloride infusion ( Intravenous New Bag/Given 03/27/17 1642)  dextrose 50 % solution (not administered)  dextrose 50 % solution (not administered)  sodium chloride 0.9 % bolus 500 mL (0 mLs Intravenous Stopped 2017/03/27 1620)  morphine 4 MG/ML injection 4 mg (4 mg Intravenous Given 03/27/2017 1522)  dextrose 50 % solution 50 mL (50 mLs Intravenous Given 03-27-17 1619)     Initial Impression / Assessment and Plan / ED Course  I have reviewed the triage vital signs and the nursing notes.  Pertinent labs & imaging results that were available during my care of the patient were reviewed by me and considered in my medical decision making (see chart for details).     Patient presents with pain in both of his thighs.  Began yesterday.  Discussed with patient's oncologist and has basically non-treatable AML.  Plan was for discussion of hospice this afternoon.  While in the ER patient had episodes of hypoglycemia.  Required repeat boluses D50 and D5 drip started.  Continue to have worsening mental status.  Patient mental status decreased.  I discussed with the patient's son and was made a DNR and likely was going to be comfort care.  Patient  then developed some crepitance in his left thigh.  X-ray of the lumbar spine showed some air in the tissue.  On reexamination there was some increased swelling of the left thigh with crepitance.  Likely necrotizing fasciitis.  Some posterior involvement but mostly on the thigh.  There is a bruising on the left flank area which is also new.  The patient had been getting continued comfort measures.  Became more bradycardic and died at 69 with his son at bedside.  I discussed with palliative care prior to his passing but they were informed it was likely going to go quickly.  I will share this report with Dr. Marin Olp in either he or I will sign the death certificate.  CRITICAL CARE Performed by: Davonna Belling Total critical care time: 30 minutes Critical care time was exclusive of separately billable procedures and treating other patients. Critical care was necessary to treat or prevent imminent or life-threatening deterioration. Critical care was time spent personally by me on the following activities: development of treatment plan with patient and/or surrogate as well as nursing, discussions with consultants, evaluation of patient's response to treatment, examination of patient, obtaining history from patient or surrogate, ordering and performing treatments and interventions, ordering and review of laboratory studies, ordering and review of radiographic studies, pulse oximetry and re-evaluation of patient's condition.   Final Clinical Impressions(s) / ED Diagnoses   Final diagnoses:  Acute myeloid leukemia not having achieved remission ([place])  Necrotizing fasciitis [facility])  Renal insufficiency    ED Discharge Orders    None       Davonna Belling, MD 04-25-17 1753

## 2017-04-13 DEATH — deceased
